# Patient Record
Sex: Male | Born: 1970 | Race: White | Hispanic: No | Marital: Single | Smoking: Never smoker
Health system: Southern US, Community
[De-identification: ages and names within clinical notes are randomized; demographics above are authoritative.]

## PROBLEM LIST (undated history)

## (undated) DIAGNOSIS — Z789 Other specified health status: Secondary | ICD-10-CM

## (undated) DIAGNOSIS — F909 Attention-deficit hyperactivity disorder, unspecified type: Secondary | ICD-10-CM

## (undated) DIAGNOSIS — Z9109 Other allergy status, other than to drugs and biological substances: Secondary | ICD-10-CM

## (undated) HISTORY — PX: NO PAST SURGERIES: SHX2092

## (undated) HISTORY — DX: Attention-deficit hyperactivity disorder, unspecified type: F90.9

---

## 1997-07-17 ENCOUNTER — Emergency Department (HOSPITAL_COMMUNITY): Admission: EM | Admit: 1997-07-17 | Discharge: 1997-07-17 | Payer: Self-pay | Admitting: Emergency Medicine

## 1997-07-23 ENCOUNTER — Emergency Department (HOSPITAL_COMMUNITY): Admission: EM | Admit: 1997-07-23 | Discharge: 1997-07-23 | Payer: Self-pay | Admitting: Emergency Medicine

## 2011-10-01 ENCOUNTER — Ambulatory Visit: Payer: Self-pay | Admitting: Family Medicine

## 2011-10-01 VITALS — BP 130/83 | HR 94 | Temp 99.5°F | Resp 16 | Ht 65.5 in | Wt 169.0 lb

## 2011-10-01 DIAGNOSIS — L089 Local infection of the skin and subcutaneous tissue, unspecified: Secondary | ICD-10-CM

## 2011-10-01 MED ORDER — SULFAMETHOXAZOLE-TRIMETHOPRIM 800-160 MG PO TABS: 1.0000 | ORAL_TABLET | Freq: Two times a day (BID) | ORAL | Status: AC

## 2011-10-01 NOTE — Progress Notes (Signed)
  [facility]:  Office Visit  Chief Complaint:  Chief Complaint  Patient presents with  . Mass    inside nostril and on outside of nose since sun    HPI: Roberto Fisher is a 41 y.o. male who complains of  Nasal skin infection, mass inside left nostril x 5 days. + pain. Fever. No prior skin infection. No SOB, CP. + HA. Denies URI sxs. Tried MOtrin and Tylenol without relief  History reviewed. No pertinent past medical history. History reviewed. No pertinent past surgical history. History   Social History  . Marital Status: Single    Spouse Name: N/A    Number of Children: N/A  . Years of Education: N/A   Social History Main Topics  . Smoking status: Never Smoker   . Smokeless tobacco: None  . Alcohol Use: No  . Drug Use: No  . Sexually Active: None   Other Topics Concern  . None   Social History Narrative  . None   No family history on file. No Known Allergies Prior to Admission medications   Medication Sig Start Date End Date Taking? Authorizing Provider  acetaminophen (TYLENOL) 325 MG tablet Take 650 mg by mouth every 6 (six) hours as needed.   Yes Historical Provider, MD     ROS: The patient denies fevers, chills, night sweats, unintentional weight loss, chest pain, palpitations, wheezing, dyspnea on exertion, nausea, vomiting, abdominal pain, dysuria, hematuria, melena, numbness, weakness, or tingling.   All other systems have been reviewed and were otherwise negative with the exception of those mentioned in the HPI and as above.    PHYSICAL EXAM: Filed Vitals:   10/01/11 1715  BP: 130/83  Pulse: 94  Temp: 99.5 F (37.5 C)  Resp: 16   Filed Vitals:   10/01/11 1715  Height: 5' 5.5" (1.664 m)  Weight: 169 lb (76.658 kg)   Body mass index is 27.70 kg/(m^2).  General: Alert, no acute distress HEENT:  Normocephalic, atraumatic, oropharynx patent. Hard nodular lesion on inner left nasal passage ( not completely blocked-about 60%).   Tender and warm on left maxiallary region. No exudates.  Cardiovascular:  Regular rate and rhythm, no rubs murmurs or gallops.  No Carotid bruits, radial pulse intact. No pedal edema.  Respiratory: Clear to auscultation bilaterally.  No wheezes, rales, or rhonchi.  No cyanosis, no use of accessory musculature GI: No organomegaly, abdomen is soft and non-tender, positive bowel sounds.  No masses. Skin: Hard nodular lesion on inner left nasal passage ( not completely blocked-about 60%).  Tender and warm on left maxiallary region.  Neurologic: Facial musculature symmetric. Psychiatric: Patient is appropriate throughout our interaction. Lymphatic: No cervical lymphadenopathy Musculoskeletal: Gait intact.   LABS: No results found for this or any previous visit.   EKG/XRAY:   Primary read interpreted by Dr. Conley Rolls at [facility].   ASSESSMENT/PLAN: Encounter Diagnosis  Name Primary?  . Skin infection Yes   Rx Bactrim DS BID ? Cellulitis, early onset Abscess  Consider CT of sinuses if worse Patient declines xray today.  Self pay     Rockne Coons, DO 10/01/2011 5:54 PM

## 2011-12-25 ENCOUNTER — Encounter (HOSPITAL_COMMUNITY): Payer: Self-pay | Admitting: Emergency Medicine

## 2011-12-25 ENCOUNTER — Emergency Department (HOSPITAL_COMMUNITY): Payer: Self-pay

## 2011-12-25 ENCOUNTER — Emergency Department (HOSPITAL_COMMUNITY)
Admission: EM | Admit: 2011-12-25 | Discharge: 2011-12-26 | Disposition: A | Discharge status: Home / Self Care | Payer: Self-pay | Attending: Emergency Medicine | Admitting: Emergency Medicine

## 2011-12-25 DIAGNOSIS — N200 Calculus of kidney: Secondary | ICD-10-CM | POA: Insufficient documentation

## 2011-12-25 DIAGNOSIS — M549 Dorsalgia, unspecified: Secondary | ICD-10-CM | POA: Insufficient documentation

## 2011-12-25 LAB — CBC WITH DIFFERENTIAL/PLATELET
Eosinophils Relative: 0 % (ref 0–5)
HCT: 45 % (ref 39.0–52.0)
Hemoglobin: 15.1 g/dL (ref 13.0–17.0)
Lymphocytes Relative: 15 % (ref 12–46)
Lymphs Abs: 2 10*3/uL (ref 0.7–4.0)
MCV: 84 fL (ref 78.0–100.0)
Monocytes Absolute: 0.7 10*3/uL (ref 0.1–1.0)
Monocytes Relative: 6 % (ref 3–12)
Neutro Abs: 10.1 10*3/uL — ABNORMAL HIGH (ref 1.7–7.7)
RDW: 12.4 % (ref 11.5–15.5)
WBC: 12.8 10*3/uL — ABNORMAL HIGH (ref 4.0–10.5)

## 2011-12-25 LAB — URINALYSIS, ROUTINE W REFLEX MICROSCOPIC
Bilirubin Urine: NEGATIVE
Nitrite: NEGATIVE
Protein, ur: NEGATIVE mg/dL
Specific Gravity, Urine: 1.022 (ref 1.005–1.030)
Urobilinogen, UA: 0.2 mg/dL (ref 0.0–1.0)

## 2011-12-25 LAB — COMPREHENSIVE METABOLIC PANEL
Albumin: 4.2 g/dL (ref 3.5–5.2)
Alkaline Phosphatase: 77 U/L (ref 39–117)
BUN: 8 mg/dL (ref 6–23)
Calcium: 9.4 mg/dL (ref 8.4–10.5)
GFR calc Af Amer: >90 mL/min (ref >90)
Potassium: 3.5 mEq/L (ref 3.5–5.1)
Sodium: 139 mEq/L (ref 135–145)
Total Protein: 7.8 g/dL (ref 6.0–8.3)

## 2011-12-25 MED ORDER — OXYCODONE-ACETAMINOPHEN 5-325 MG PO TABS: 1.0000 | ORAL_TABLET | ORAL | Status: DC | PRN

## 2011-12-25 MED ORDER — ONDANSETRON 4 MG PO TBDP
8.0000 mg | ORAL_TABLET | Freq: Once | ORAL | Status: AC
  Administered 2011-12-25: 8 mg via ORAL
  Filled 2011-12-25: qty 2

## 2011-12-25 MED ORDER — OXYCODONE-ACETAMINOPHEN 5-325 MG PO TABS
1.0000 | ORAL_TABLET | Freq: Once | ORAL | Status: AC
  Administered 2011-12-25: 1 via ORAL
  Filled 2011-12-25: qty 1

## 2011-12-25 MED ORDER — IBUPROFEN 800 MG PO TABS: 800.0000 mg | ORAL_TABLET | Freq: Three times a day (TID) | ORAL | Status: DC

## 2011-12-25 NOTE — ED Notes (Signed)
PT. REPORTS RLQ AND RIGHT LOWER BACK PAIN FOR 4 DAYS , DENIES NAUSEA /VOMITTING OR DIARRHEA , NO DYSURIA OR HEMATURIA , NO FEVER OR CHILLS.

## 2011-12-25 NOTE — ED Provider Notes (Signed)
Medical screening examination/treatment/procedure(s) were performed by non-physician practitioner and as supervising physician I was immediately available for consultation/collaboration.   Sanaiya Welliver, MD 12/25/11 2356 

## 2011-12-25 NOTE — ED Notes (Signed)
Patient is resting comfortably. 

## 2011-12-25 NOTE — ED Provider Notes (Signed)
History     CSN: [id number]  Arrival date & time 12/25/11  1859   First MD Initiated Contact with Patient 12/25/11 2204      Chief Complaint  Patient presents with  . Abdominal Pain    (Consider location/radiation/quality/duration/timing/severity/associated sxs/prior treatment) Patient is a 41 y.o. male presenting with abdominal pain. The history is provided by the patient.  Abdominal Pain The primary symptoms of the illness include abdominal pain. The primary symptoms of the illness do not include fever, fatigue, shortness of breath, nausea, vomiting, diarrhea, hematemesis, hematochezia, dysuria, vaginal discharge or vaginal bleeding. Episode onset: 3 days ago  The onset of the illness was gradual. The problem has been gradually worsening.  Associated with: denies abx use, travel, or NSAID use. The patient has not had a change in bowel habit. Additional symptoms associated with the illness include back pain (right flank ). Symptoms associated with the illness do not include chills, anorexia, diaphoresis, heartburn, constipation, urgency, hematuria or frequency. Significant associated medical issues do not include GERD, diabetes, sickle cell disease, gallstones, liver disease, substance abuse, diverticulitis, HIV or cardiac disease.    History reviewed. No pertinent past medical history.  History reviewed. No pertinent past surgical history.  No family history on file.  History  Substance Use Topics  . Smoking status: Never Smoker   . Smokeless tobacco: Not on file  . Alcohol Use: No      Review of Systems  Constitutional: Negative for fever, chills, diaphoresis and fatigue.  Respiratory: Negative for shortness of breath.   Gastrointestinal: Positive for abdominal pain. Negative for heartburn, nausea, vomiting, diarrhea, constipation, hematochezia, anorexia and hematemesis.  Genitourinary: Negative for dysuria, urgency, frequency, hematuria, vaginal bleeding and vaginal  discharge.  Musculoskeletal: Positive for back pain (right flank ).    Allergies  Review of patient's allergies indicates no known allergies.  Home Medications   Current Outpatient Rx  Name Route Sig Dispense Refill  . ACETAMINOPHEN 500 MG PO TABS Oral Take 1,000 mg by mouth every 6 (six) hours as needed. For pain      BP 132/79  Pulse 92  Temp 98 F (36.7 C) (Oral)  Resp 18  SpO2 96%  Physical Exam  Nursing note and vitals reviewed. Constitutional: He is oriented to person, place, and time. He appears well-developed and well-nourished. No distress.  HENT:  Head: Normocephalic and atraumatic.  Eyes: Conjunctivae normal and EOM are normal.  Neck: Normal range of motion. Neck supple.  Cardiovascular: Normal rate, regular rhythm and normal heart sounds.   Pulmonary/Chest: Effort normal.  Abdominal: Soft. Normal appearance and bowel sounds are normal. He exhibits no distension, no ascites and no pulsatile midline mass. There is tenderness (CVA tenderness & RLQ ttp). There is CVA tenderness.  Musculoskeletal: Normal range of motion.  Neurological: He is alert and oriented to person, place, and time.  Skin: Skin is warm and dry. He is not diaphoretic.  Psychiatric: He has a normal mood and affect. His behavior is normal.    ED Course  Procedures (including critical care time)  Labs Reviewed  URINALYSIS, ROUTINE W REFLEX MICROSCOPIC - Abnormal; Notable for the following:    Hgb urine dipstick TRACE (*)     All other components within normal limits  COMPREHENSIVE METABOLIC PANEL - Abnormal; Notable for the following:    Glucose, Bld 116 (*)     All other components within normal limits  CBC WITH DIFFERENTIAL - Abnormal; Notable for the following:    WBC 12.8 (*)  Neutrophils Relative 79 (*)     Neutro Abs 10.1 (*)     All other components within normal limits  URINE MICROSCOPIC-ADD ON   Ct Abdomen Pelvis Wo Contrast  12/25/2011  *RADIOLOGY REPORT*  Clinical Data:  Right sided flank pain and abdominal pain.  CT ABDOMEN AND PELVIS WITHOUT CONTRAST  Technique:  Multidetector CT imaging of the abdomen and pelvis was performed following the standard protocol without intravenous contrast.  Comparison: None.  Findings: Minimal dependent changes in the lung bases.  Small esophageal hiatal hernia.  Mild distal esophageal wall thickening could be due to under distension or reflux.  There is a punctate sized stone in the lower pole of the left kidney without obstruction.  No right renal, ureteral, or bladder stones are visualized.  The bladder wall is not thickened. Exophytic hyperdense lesion arising from the midpole of the left kidney laterally measures 13 mm diameter.  This could represent a hemorrhagic cyst although solid mass is not excluded.  Consider ultrasound for further evaluation.  Circumscribed low attenuation lesions in the anterior right lobe of liver and medial left lobe of liver measuring 14 and 12 mm diameter, respectively, these likely represent cysts.  The unenhanced appearance of the spleen, gallbladder, pancreas, adrenal glands, abdominal aorta, and retroperitoneal lymph nodes is unremarkable.  The stomach, small bowel, and colon are decompressed.  Diffusely stool filled colon.  No free air or free fluid in the abdomen.  Pelvis:  Prostate gland is not enlarged.  Bladder wall is not thickened.  No free or loculated pelvic fluid collections.  No evidence of diverticulitis.  The appendix is normal.  Mild degenerative changes in the lumbar spine.  IMPRESSION: Punctate sized nonobstructing stone in the left kidney.  No additional renal or ureteral stones are demonstrated.  No urinary tract obstruction.  13 mm hyperattenuating lesion in the left kidney probably represents a hemorrhagic cyst.  Solid mass not entirely excluded.   Original Report Authenticated By: Marlon Pel, M.D.      No diagnosis found.    MDM  Kidney stone  Pt has been diagnosed with a  Kidney Stone via CT. Incidental finding of left kidney lesion also discussed w pt & recommended follow up. There is no evidence of significant hydronephrosis, serum creatine WNL, vitals sign stable and the pt does not have irratractable vomiting. Pt will be dc home with pain medications & has been advised to follow up with PCP.          Jaci Carrel, [place] 12/25/11 2351

## 2013-06-03 ENCOUNTER — Emergency Department (HOSPITAL_COMMUNITY): Payer: Self-pay

## 2013-06-03 ENCOUNTER — Inpatient Hospital Stay (HOSPITAL_COMMUNITY)
Admission: EM | Admit: 2013-06-03 | Discharge: 2013-06-08 | DRG: 872 | Disposition: A | Discharge status: Home / Self Care | Payer: Self-pay | Attending: Family Medicine | Admitting: Family Medicine

## 2013-06-03 ENCOUNTER — Encounter (HOSPITAL_COMMUNITY): Payer: Self-pay | Admitting: Emergency Medicine

## 2013-06-03 ENCOUNTER — Emergency Department (INDEPENDENT_AMBULATORY_CARE_PROVIDER_SITE_OTHER)
Admission: EM | Admit: 2013-06-03 | Discharge: 2013-06-03 | Disposition: A | Discharge status: Nursing Home (Includes ICF) | Payer: Self-pay | Source: Home / Self Care | Attending: Emergency Medicine | Admitting: Emergency Medicine

## 2013-06-03 DIAGNOSIS — L0291 Cutaneous abscess, unspecified: Secondary | ICD-10-CM

## 2013-06-03 DIAGNOSIS — A419 Sepsis, unspecified organism: Principal | ICD-10-CM | POA: Diagnosis present

## 2013-06-03 DIAGNOSIS — L02219 Cutaneous abscess of trunk, unspecified: Secondary | ICD-10-CM | POA: Diagnosis present

## 2013-06-03 DIAGNOSIS — L0390 Cellulitis, unspecified: Secondary | ICD-10-CM

## 2013-06-03 DIAGNOSIS — Z8249 Family history of ischemic heart disease and other diseases of the circulatory system: Secondary | ICD-10-CM

## 2013-06-03 DIAGNOSIS — L03319 Cellulitis of trunk, unspecified: Secondary | ICD-10-CM

## 2013-06-03 DIAGNOSIS — Z833 Family history of diabetes mellitus: Secondary | ICD-10-CM

## 2013-06-03 DIAGNOSIS — F411 Generalized anxiety disorder: Secondary | ICD-10-CM | POA: Diagnosis present

## 2013-06-03 DIAGNOSIS — L03311 Cellulitis of abdominal wall: Secondary | ICD-10-CM | POA: Diagnosis present

## 2013-06-03 HISTORY — DX: Other specified health status: Z78.9

## 2013-06-03 LAB — BASIC METABOLIC PANEL
BUN: 17 mg/dL (ref 6–23)
CALCIUM: 9.4 mg/dL (ref 8.4–10.5)
CO2: 27 mEq/L (ref 19–32)
Chloride: 97 mEq/L (ref 96–112)
Creatinine, Ser: 1.07 mg/dL (ref 0.50–1.35)
GFR, EST NON AFRICAN AMERICAN: 83 mL/min — AB (ref >90)
Glucose, Bld: 112 mg/dL — ABNORMAL HIGH (ref 70–99)
POTASSIUM: 4.8 meq/L (ref 3.7–5.3)
SODIUM: 140 meq/L (ref 137–147)

## 2013-06-03 LAB — CBC WITH DIFFERENTIAL/PLATELET
BASOS ABS: 0 10*3/uL (ref 0.0–0.1)
BASOS PCT: 0 % (ref 0–1)
EOS ABS: 0 10*3/uL (ref 0.0–0.7)
Eosinophils Relative: 0 % (ref 0–5)
HCT: 43.3 % (ref 39.0–52.0)
Hemoglobin: 14.9 g/dL (ref 13.0–17.0)
Lymphocytes Relative: 3 % — ABNORMAL LOW (ref 12–46)
Lymphs Abs: 0.7 10*3/uL (ref 0.7–4.0)
MCH: 28.9 pg (ref 26.0–34.0)
MCHC: 34.4 g/dL (ref 30.0–36.0)
MCV: 83.9 fL (ref 78.0–100.0)
Monocytes Absolute: 1.7 10*3/uL — ABNORMAL HIGH (ref 0.1–1.0)
Monocytes Relative: 8 % (ref 3–12)
NEUTROS ABS: 19.3 10*3/uL — AB (ref 1.7–7.7)
NEUTROS PCT: 89 % — AB (ref 43–77)
Platelets: 228 10*3/uL (ref 150–400)
RBC: 5.16 MIL/uL (ref 4.22–5.81)
RDW: 12 % (ref 11.5–15.5)
WBC: 21.8 10*3/uL — ABNORMAL HIGH (ref 4.0–10.5)

## 2013-06-03 LAB — I-STAT CG4 LACTIC ACID, ED: Lactic Acid, Venous: 2.73 mmol/L — ABNORMAL HIGH (ref 0.5–2.2)

## 2013-06-03 MED ORDER — CLINDAMYCIN PHOSPHATE 900 MG/50ML IV SOLN
900.0000 mg | Freq: Once | INTRAVENOUS | Status: AC
  Administered 2013-06-03: 900 mg via INTRAVENOUS
  Filled 2013-06-03: qty 50

## 2013-06-03 MED ORDER — IOHEXOL 300 MG/ML  SOLN
80.0000 mL | Freq: Once | INTRAMUSCULAR | Status: AC | PRN
  Administered 2013-06-03: 80 mL via INTRAVENOUS

## 2013-06-03 MED ORDER — IOHEXOL 300 MG/ML  SOLN
25.0000 mL | Freq: Once | INTRAMUSCULAR | Status: AC | PRN
  Administered 2013-06-03: 25 mL via ORAL

## 2013-06-03 MED ORDER — ONDANSETRON 4 MG PO TBDP
ORAL_TABLET | ORAL | Status: AC
  Filled 2013-06-03: qty 1

## 2013-06-03 MED ORDER — SODIUM CHLORIDE 0.9 % IV SOLN: INTRAVENOUS | Status: DC

## 2013-06-03 MED ORDER — ONDANSETRON 4 MG PO TBDP
8.0000 mg | ORAL_TABLET | Freq: Once | ORAL | Status: AC
  Administered 2013-06-03: 8 mg via ORAL

## 2013-06-03 MED ORDER — LORAZEPAM 1 MG PO TABS
1.0000 mg | ORAL_TABLET | Freq: Once | ORAL | Status: AC
  Administered 2013-06-03: 1 mg via ORAL
  Filled 2013-06-03: qty 1

## 2013-06-03 MED ORDER — SODIUM CHLORIDE 0.9 % IV BOLUS (SEPSIS)
1000.0000 mL | Freq: Once | INTRAVENOUS | Status: AC
  Administered 2013-06-04: 1000 mL via INTRAVENOUS

## 2013-06-03 MED ORDER — MORPHINE SULFATE 2 MG/ML IJ SOLN
2.0000 mg | Freq: Once | INTRAMUSCULAR | Status: AC
  Administered 2013-06-04: 2 mg via INTRAVENOUS
  Filled 2013-06-03: qty 1

## 2013-06-03 MED ORDER — ACETAMINOPHEN 325 MG PO TABS
650.0000 mg | ORAL_TABLET | Freq: Once | ORAL | Status: AC
  Administered 2013-06-03: 650 mg via ORAL
  Filled 2013-06-03: qty 2

## 2013-06-03 MED ORDER — ONDANSETRON HCL 4 MG/2ML IJ SOLN
INTRAMUSCULAR | Status: AC
  Filled 2013-06-03: qty 2

## 2013-06-03 MED ORDER — SODIUM CHLORIDE 0.9 % IV BOLUS (SEPSIS)
1000.0000 mL | Freq: Once | INTRAVENOUS | Status: AC
  Administered 2013-06-03: 1000 mL via INTRAVENOUS

## 2013-06-03 NOTE — ED Provider Notes (Signed)
  Chief Complaint   No chief complaint on file.   History of Present Illness   Roberto Fisher is a 43 year old male who's had a three-day history of an abscess in the left lower quadrant of his abdomen. This started out as a small red bump which he attributed to an insect bite, however he did not see anything biting him. Additionally, he said temperature 100.4 degrees, chills, tachycardia, nausea, and vomiting. He denies any shortness of breath or chest pain. No prior history of abscesses, skin infections, MRSA, or diabetes.  Review of Systems   Other than as noted above, the patient denies any of the following symptoms: Systemic:  No fever, chills or sweats. Skin:  No rash or itching.  [place]   Past medical history, family history, social history, meds, and allergies were reviewed.   Physical Examination     Vital signs:  BP 146/93  Pulse 123  Temp(Src) 100.4 F (38 C) (Oral)  Resp 16  SpO2 98% Skin:  He has a very large, indurated, tender, raised, red abscess in the left lower quadrant of his abdomen, taking up the entire left lower quadrant. There was no fluctuance.  Skin exam was otherwise normal.  No rash. Ext:  Distal pulses were full, patient has full ROM of all joints.  Course in [facility]   He had persisting emesis in the urgent care Center and was given Zofran ODT 4 mg sublingually prior to being transferred to the emergency room.  Assessment   The encounter diagnosis was Abscess.  He will need the IV saline his protocol, since she's not able to keep by mouth and buttocks down. We will transfer him to the emergency department for this. He will need incision and drainage when he gets there.  Plan   The patient was transferred to the ED via shuttle in stable condition.  Medical Decision Making   43 year old male has a 3 day history of a very large abscess in the LLQ of his abdomen, fever, chills, vomiting, and tachycardia.  He has never had anything  like this before--no history of MRSA or diabetes.  On exam he has a very large, tender abscess in the LLQ of his abdomen.  I think he will need the cellulitis protocol with IV antibiotics as well as an I&D.       Roberto Mo, MD 06/03/13 [phone]

## 2013-06-03 NOTE — Discharge Instructions (Signed)
We have determined that your problem requires further evaluation in the emergency department.  We will take care of your transport there.  Once at the emergency department, you will be evaluated by a provider and they will order whatever treatment or tests they deem necessary.  We cannot guarantee that they will do any specific test or do any specific treatment.  ° °

## 2013-06-03 NOTE — ED Notes (Signed)
Patient transported to X-ray 

## 2013-06-03 NOTE — ED Provider Notes (Signed)
Medical screening examination/treatment/procedure(s) were conducted as a shared visit with non-physician practitioner(s) and myself.  I personally evaluated the patient during the encounter.   EKG Interpretation None      PT with large area of cellulitis and ?abscess on bedside US but no definite collection on CT. Admit for fever,leukocytosis. Abx given.   Amonda Brillhart B. Karle Starch, MD 06/03/13 2358

## 2013-06-03 NOTE — ED Notes (Signed)
Abscess LLQ abdomen onset 3 days ago.  No drainage.  He squeezed it yesterday and it drained yellow pus. Dr. Jake Michaelis said it is to large to be done here at [facility] and needs to go to ED.  Pt. Vomited large amount of yellowish liquid  in garbage can.  Appears pale.

## 2013-06-03 NOTE — ED Notes (Signed)
Pt. reports left abdomen skin abscess for 3 days with drainage , denies fever or chills.

## 2013-06-03 NOTE — ED Provider Notes (Signed)
CSN: [id number]     Arrival date & time 06/03/13  1852 History   First MD Initiated Contact with Patient 06/03/13 2013     Chief Complaint  Patient presents with  . Abscess   HPI  History provided by the patient and recent medical chart. Patient is a 43 year old male with no significant PMH who presents for further evaluation and treatment of the left lower abdomen abscess and cellulitis. Patient was sent from urgent care center prior to arrival. He reports having increasing redness and swelling to the skin of his lower abdomen for the past 3 days. He also states he's been feeling sick for the past week with sore throat, cough, congestion, fever and sweats. He states he has had some increased fatigue especially since he has had a redness to the skin of his abdomen. Denies having similar symptoms previously. He reports the area first began but a small ingrown hair or pimple. He denied having any significant bleeding or drainage. He denies any other pertinent past medical history. No other aggravating or alleviating factors. No other associated symptoms.    History reviewed. No pertinent past medical history. History reviewed. No pertinent past surgical history. No family history on file. History  Substance Use Topics  . Smoking status: Never Smoker   . Smokeless tobacco: Not on file  . Alcohol Use: No    Review of Systems  Constitutional: Positive for fever, chills and fatigue.  HENT: Positive for congestion, rhinorrhea and sore throat.   Respiratory: Positive for cough.   Gastrointestinal: Negative for nausea, vomiting and diarrhea.  All other systems reviewed and are negative.      Allergies  Review of patient's allergies indicates no known allergies.  Home Medications   Current Outpatient Rx  Name  Route  Sig  Dispense  Refill  . acetaminophen (TYLENOL) 500 MG tablet   Oral   Take 1,000 mg by mouth every 6 (six) hours as needed. For pain          BP 92/70  Pulse 129   Temp(Src) 101.1 F (38.4 C) (Oral)  Resp 24  Wt 158 lb (71.668 kg)  SpO2 100% Physical Exam  Nursing note and vitals reviewed. Constitutional: He is oriented to person, place, and time. He appears well-developed and well-nourished. No distress.  HENT:  Head: Normocephalic.  Cardiovascular: Normal rate and regular rhythm.   Pulmonary/Chest: Effort normal and breath sounds normal. No respiratory distress. He has no wheezes. He has no rales.  Abdominal: Soft. He exhibits no distension. There is tenderness. There is no rebound and no guarding.  Large area of erythema to the left lower abdomen. There is central nodularity and induration. No active bleeding or drainage. Bedside ultrasound shows large fluid collection.  Musculoskeletal: Normal range of motion.  Neurological: He is alert and oriented to person, place, and time.  Skin: Skin is warm.  Psychiatric: He has a normal mood and affect. His behavior is normal.    ED Course  Procedures   DIAGNOSTIC STUDIES: Oxygen Saturation is 98% on room air.    COORDINATION OF CARE:  Nursing notes reviewed. Vital signs reviewed. Initial pt interview and examination performed.   9:05 PM-patient seen and evaluated. He appears in moderate distress. Does not appear severely ill or toxic. Patient is febrile with tachycardia. Blood pressures are stable.   9:50 PM patient also seen and evaluated with attending physician. Given the size of abscess on the bedside ultrasound we'll proceed to CT scan to rule  out muscle involvement.  11:50 PM CT scan does not show significant abscess. Puncture I&D also did not express any purulent fluid.  Spoke with Dr. Glyn Ade [facility]. He will see patient and admit. Would like blood cultures ordered.    INCISION AND DRAINAGE Performed by: Martie Lee Consent: Verbal consent obtained. Risks and benefits: risks, benefits and alternatives were discussed Type: abscess  Body area: Left lower  abdominal wall  Anesthesia: local infiltration  Incision was made with a scalpel. Single puncture incision used.  Local anesthetic: lidocaine 2% with epinephrine  Anesthetic total: 2 ml  Complexity: Simple   Drainage: purulent  Drainage amount: None   Packing material: None   Patient tolerance: Patient tolerated the procedure well with no immediate complications.      Treatment plan initiated: Medications  sodium chloride 0.9 % bolus 1,000 mL (not administered)  clindamycin (CLEOCIN) IVPB 900 mg (not administered)  acetaminophen (TYLENOL) tablet 650 mg (not administered)  LORazepam (ATIVAN) tablet 1 mg (not administered)   Results for orders placed during the hospital encounter of 06/03/13  CBC WITH DIFFERENTIAL      Result Value Ref Range   WBC 21.8 (*) 4.0 - 10.5 K/uL   RBC 5.16  4.22 - 5.81 MIL/uL   Hemoglobin 14.9  13.0 - 17.0 g/dL   HCT 43.3  39.0 - 52.0 %   MCV 83.9  78.0 - 100.0 fL   MCH 28.9  26.0 - 34.0 pg   MCHC 34.4  30.0 - 36.0 g/dL   RDW 12.0  11.5 - 15.5 %   Platelets 228  150 - 400 K/uL   Neutrophils Relative % 89 (*) 43 - 77 %   Neutro Abs 19.3 (*) 1.7 - 7.7 K/uL   Lymphocytes Relative 3 (*) 12 - 46 %   Lymphs Abs 0.7  0.7 - 4.0 K/uL   Monocytes Relative 8  3 - 12 %   Monocytes Absolute 1.7 (*) 0.1 - 1.0 K/uL   Eosinophils Relative 0  0 - 5 %   Eosinophils Absolute 0.0  0.0 - 0.7 K/uL   Basophils Relative 0  0 - 1 %   Basophils Absolute 0.0  0.0 - 0.1 K/uL  BASIC METABOLIC PANEL      Result Value Ref Range   Sodium 140  137 - 147 mEq/L   Potassium 4.8  3.7 - 5.3 mEq/L   Chloride 97  96 - 112 mEq/L   CO2 27  19 - 32 mEq/L   Glucose, Bld 112 (*) 70 - 99 mg/dL   BUN 17  6 - 23 mg/dL   Creatinine, Ser 1.07  0.50 - 1.35 mg/dL   Calcium 9.4  8.4 - 10.5 mg/dL   GFR calc non Af Amer 83 (*) >90 mL/min   GFR calc Af Amer >90  >90 mL/min  I-STAT CG4 LACTIC ACID, ED      Result Value Ref Range   Lactic Acid, Venous 2.73 (*) 0.5 - 2.2 mmol/L      Imaging Review Dg Chest 2 View  06/03/2013   CLINICAL DATA:  Fever.  EXAM: CHEST  2 VIEW  COMPARISON:  None.  FINDINGS: The heart size and mediastinal contours are within normal limits. Both lungs are clear. The visualized skeletal structures are unremarkable.  IMPRESSION: No active cardiopulmonary disease.   Electronically Signed   By: Marcello Moores  Register   On: 06/03/2013 21:56   Ct Abdomen Pelvis W Contrast  06/03/2013   CLINICAL DATA:  Left lower quadrant abdominal pain.  EXAM: CT ABDOMEN AND PELVIS WITH CONTRAST  TECHNIQUE: Multidetector CT imaging of the abdomen and pelvis was performed using the standard protocol following bolus administration of intravenous contrast.  CONTRAST:  32mL OMNIPAQUE IOHEXOL 300 MG/ML  SOLN  COMPARISON:  CT of the abdomen and pelvis 12/25/2011.  FINDINGS: Lung Bases: Unremarkable.  Abdomen/Pelvis: Multiple low-attenuation liver lesions, similar to the prior examination. Largest of these are compatible with simple cysts, with the largest lesion in segment 2 measuring up to 15 mm in diameter. The appearance of the gallbladder, pancreas, spleen, bilateral adrenal glands and right kidney is unremarkable. There are 2 intermediate attenuation lesions in the left kidney, which appear similar to the prior study, likely to represent proteinaceous cysts, largest of which measures 19 mm in the interpolar region. Probable 2 mm nonobstructive calculus in the interpolar collecting system of the left kidney, similar to the prior study.  No significant volume of ascites. No pneumoperitoneum. No pathologic distention of small bowel. No lymphadenopathy identified within the abdomen or pelvis. Prostate gland and urinary bladder are unremarkable in appearance.  Musculoskeletal: There is a large amount of soft tissue stranding throughout the subcutaneous fat of the lower left anterior abdominal wall extending into the left flank. At this time, no definite well-defined rim enhancing fluid  collection is identified to suggest presence of an abscess. There are no aggressive appearing lytic or blastic lesions noted in the visualized portions of the skeleton.  IMPRESSION: 1. Extensive inflammation in the subcutaneous fat of the lower left anterior abdominal wall and left flank. Findings appear compatible with cellulitis and possible phlegmon, but no definite abscess is identified at this time. 2. No acute intra-abdominal process. 3. Additional incidental findings, as above, similar prior studies.   Electronically Signed   By: Vinnie Langton M.D.   On: 06/03/2013 23:17     EKG Interpretation None      MDM   Final diagnoses:  Cellulitis of left abdominal wall       Martie Lee, PA-C 06/03/13 2355

## 2013-06-04 ENCOUNTER — Encounter (HOSPITAL_COMMUNITY): Payer: Self-pay | Admitting: Internal Medicine

## 2013-06-04 DIAGNOSIS — L02219 Cutaneous abscess of trunk, unspecified: Secondary | ICD-10-CM

## 2013-06-04 DIAGNOSIS — A419 Sepsis, unspecified organism: Secondary | ICD-10-CM | POA: Diagnosis present

## 2013-06-04 DIAGNOSIS — L03319 Cellulitis of trunk, unspecified: Secondary | ICD-10-CM

## 2013-06-04 LAB — CBC WITH DIFFERENTIAL/PLATELET
Basophils Absolute: 0 10*3/uL (ref 0.0–0.1)
Basophils Relative: 0 % (ref 0–1)
Eosinophils Absolute: 0 10*3/uL (ref 0.0–0.7)
Eosinophils Relative: 0 % (ref 0–5)
HCT: 37.4 % — ABNORMAL LOW (ref 39.0–52.0)
Hemoglobin: 12.7 g/dL — ABNORMAL LOW (ref 13.0–17.0)
LYMPHS PCT: 5 % — AB (ref 12–46)
Lymphs Abs: 1 10*3/uL (ref 0.7–4.0)
MCH: 28.4 pg (ref 26.0–34.0)
MCHC: 34 g/dL (ref 30.0–36.0)
MCV: 83.7 fL (ref 78.0–100.0)
Monocytes Absolute: 1.5 10*3/uL — ABNORMAL HIGH (ref 0.1–1.0)
Monocytes Relative: 8 % (ref 3–12)
NEUTROS PCT: 87 % — AB (ref 43–77)
Neutro Abs: 15.7 10*3/uL — ABNORMAL HIGH (ref 1.7–7.7)
PLATELETS: 190 10*3/uL (ref 150–400)
RBC: 4.47 MIL/uL (ref 4.22–5.81)
RDW: 12.2 % (ref 11.5–15.5)
WBC: 18.1 10*3/uL — ABNORMAL HIGH (ref 4.0–10.5)

## 2013-06-04 LAB — CBC
HEMATOCRIT: 36.2 % — AB (ref 39.0–52.0)
Hemoglobin: 12.3 g/dL — ABNORMAL LOW (ref 13.0–17.0)
MCH: 28.6 pg (ref 26.0–34.0)
MCHC: 34 g/dL (ref 30.0–36.0)
MCV: 84.2 fL (ref 78.0–100.0)
Platelets: 210 10*3/uL (ref 150–400)
RBC: 4.3 MIL/uL (ref 4.22–5.81)
RDW: 12.3 % (ref 11.5–15.5)
WBC: 20 10*3/uL — AB (ref 4.0–10.5)

## 2013-06-04 LAB — INFLUENZA PANEL BY PCR (TYPE A & B)
H1N1FLUPCR: NOT DETECTED
INFLBPCR: NEGATIVE
Influenza A By PCR: NEGATIVE

## 2013-06-04 LAB — LACTIC ACID, PLASMA: LACTIC ACID, VENOUS: 1.1 mmol/L (ref 0.5–2.2)

## 2013-06-04 LAB — COMPREHENSIVE METABOLIC PANEL
ALBUMIN: 2.7 g/dL — AB (ref 3.5–5.2)
ALT: 9 U/L (ref 0–53)
AST: 13 U/L (ref 0–37)
Alkaline Phosphatase: 66 U/L (ref 39–117)
BILIRUBIN TOTAL: 1.2 mg/dL (ref 0.3–1.2)
BUN: 13 mg/dL (ref 6–23)
CHLORIDE: 100 meq/L (ref 96–112)
CO2: 25 mEq/L (ref 19–32)
CREATININE: 1.06 mg/dL (ref 0.50–1.35)
Calcium: 8 mg/dL — ABNORMAL LOW (ref 8.4–10.5)
GFR, EST NON AFRICAN AMERICAN: 84 mL/min — AB (ref >90)
Glucose, Bld: 104 mg/dL — ABNORMAL HIGH (ref 70–99)
Potassium: 4.3 mEq/L (ref 3.7–5.3)
Sodium: 138 mEq/L (ref 137–147)
Total Protein: 6.4 g/dL (ref 6.0–8.3)

## 2013-06-04 MED ORDER — HYDROMORPHONE HCL PF 1 MG/ML IJ SOLN
1.0000 mg | INTRAMUSCULAR | Status: DC | PRN
  Administered 2013-06-05 – 2013-06-07 (×2): 1 mg via INTRAVENOUS
  Filled 2013-06-04 (×2): qty 1

## 2013-06-04 MED ORDER — ONDANSETRON HCL 4 MG PO TABS: 4.0000 mg | ORAL_TABLET | Freq: Four times a day (QID) | ORAL | Status: DC | PRN

## 2013-06-04 MED ORDER — BOOST / RESOURCE BREEZE PO LIQD
1.0000 | Freq: Two times a day (BID) | ORAL | Status: DC
  Administered 2013-06-04 – 2013-06-08 (×7): 1 via ORAL

## 2013-06-04 MED ORDER — SODIUM CHLORIDE 0.9 % IV SOLN
INTRAVENOUS | Status: AC
  Administered 2013-06-04 (×2): via INTRAVENOUS
  Administered 2013-06-04: 100 mL/h via INTRAVENOUS

## 2013-06-04 MED ORDER — ACETAMINOPHEN 325 MG PO TABS
650.0000 mg | ORAL_TABLET | Freq: Four times a day (QID) | ORAL | Status: DC | PRN
  Administered 2013-06-04 – 2013-06-05 (×6): 650 mg via ORAL
  Filled 2013-06-04 (×6): qty 2

## 2013-06-04 MED ORDER — PIPERACILLIN-TAZOBACTAM 3.375 G IVPB
3.3750 g | Freq: Three times a day (TID) | INTRAVENOUS | Status: DC
  Administered 2013-06-04 – 2013-06-08 (×14): 3.375 g via INTRAVENOUS
  Filled 2013-06-04 (×16): qty 50

## 2013-06-04 MED ORDER — VANCOMYCIN HCL 10 G IV SOLR
1250.0000 mg | Freq: Two times a day (BID) | INTRAVENOUS | Status: DC
  Administered 2013-06-04 – 2013-06-08 (×10): 1250 mg via INTRAVENOUS
  Filled 2013-06-04 (×11): qty 1250

## 2013-06-04 MED ORDER — ONDANSETRON HCL 4 MG/2ML IJ SOLN
4.0000 mg | Freq: Four times a day (QID) | INTRAMUSCULAR | Status: DC | PRN
  Administered 2013-06-04: 4 mg via INTRAVENOUS
  Filled 2013-06-04: qty 2

## 2013-06-04 MED ORDER — ACETAMINOPHEN 650 MG RE SUPP: 650.0000 mg | Freq: Four times a day (QID) | RECTAL | Status: DC | PRN

## 2013-06-04 MED ORDER — ALPRAZOLAM 0.5 MG PO TABS
0.5000 mg | ORAL_TABLET | Freq: Three times a day (TID) | ORAL | Status: DC | PRN
  Administered 2013-06-04 – 2013-06-08 (×5): 0.5 mg via ORAL
  Filled 2013-06-04 (×6): qty 1

## 2013-06-04 NOTE — Progress Notes (Signed)
I agree with the Student-Dietitian note and made appropriate revisions.  Katie Merve Hotard, RD, LDN Pager #: [phone] After-Hours Pager #: [phone]  

## 2013-06-04 NOTE — Consult Note (Signed)
Reason for Consult:Cellulitis Referring Physician: [name] is an 43 y.o. male.  HPI: Roberto Fisher, who is quite loquacious, was in his usual state of health until Tuesday when he noticed a bump on his abdomen in the LLQ. He assumed it was a mosquito bite and admits to scratching it and treating it with alcohol and H2O2. Over the next two days it became red, painful, and swollen until it was "like a baseball". He sought care at the [facility] yesterday and they sent him to the ED. The EDP attempted in I&D but did not encounter any purulence. He describes fever up to 102+ and vomited at the urgent care prior to transport. He denies chills, sweats, or prior episodes.  Past Medical History  Diagnosis Date  . Medical history non-contributory     Past Surgical History  Procedure Laterality Date  . No past surgeries      Family History  Problem Relation Age of Onset  . Hypertension Mother   . Diabetes Mellitus II Father     Social History:  reports that he has never smoked. He does not have any smokeless tobacco history on file. He reports that he does not drink alcohol or use illicit drugs.  Allergies: No Known Allergies  Medications: I have reviewed the patient's current medications.  Results for orders placed during the hospital encounter of 06/03/13 (from the past 48 hour(s))  CBC WITH DIFFERENTIAL     Status: Abnormal   Collection Time    06/03/13  8:14 PM      Result Value Ref Range   WBC 21.8 (*) 4.0 - 10.5 K/uL   RBC 5.16  4.22 - 5.81 MIL/uL   Hemoglobin 14.9  13.0 - 17.0 g/dL   HCT 43.3  39.0 - 52.0 %   MCV 83.9  78.0 - 100.0 fL   MCH 28.9  26.0 - 34.0 pg   MCHC 34.4  30.0 - 36.0 g/dL   RDW 12.0  11.5 - 15.5 %   Platelets 228  150 - 400 K/uL   Neutrophils Relative % 89 (*) 43 - 77 %   Neutro Abs 19.3 (*) 1.7 - 7.7 K/uL   Lymphocytes Relative 3 (*) 12 - 46 %   Lymphs Abs 0.7  0.7 - 4.0 K/uL   Monocytes Relative 8  3 - 12 %   Monocytes  Absolute 1.7 (*) 0.1 - 1.0 K/uL   Eosinophils Relative 0  0 - 5 %   Eosinophils Absolute 0.0  0.0 - 0.7 K/uL   Basophils Relative 0  0 - 1 %   Basophils Absolute 0.0  0.0 - 0.1 K/uL  BASIC METABOLIC PANEL     Status: Abnormal   Collection Time    06/03/13  8:14 PM      Result Value Ref Range   Sodium 140  137 - 147 mEq/L   Potassium 4.8  3.7 - 5.3 mEq/L   Chloride 97  96 - 112 mEq/L   CO2 27  19 - 32 mEq/L   Glucose, Bld 112 (*) 70 - 99 mg/dL   BUN 17  6 - 23 mg/dL   Creatinine, Ser 1.07  0.50 - 1.35 mg/dL   Calcium 9.4  8.4 - 10.5 mg/dL   GFR calc non Af Amer 83 (*) >90 mL/min   GFR calc Af Amer >90  >90 mL/min   Comment: (NOTE)     The eGFR has been calculated using the CKD  EPI equation.     This calculation has not been validated in all clinical situations.     eGFR's persistently <90 mL/min signify possible Chronic Kidney     Disease.  I-STAT CG4 LACTIC ACID, ED     Status: Abnormal   Collection Time    06/03/13  8:53 PM      Result Value Ref Range   Lactic Acid, Venous 2.73 (*) 0.5 - 2.2 mmol/L  INFLUENZA PANEL BY PCR (TYPE A & B, H1N1)     Status: None   Collection Time    06/04/13 12:33 AM      Result Value Ref Range   Influenza A By PCR NEGATIVE  NEGATIVE   Influenza B By PCR NEGATIVE  NEGATIVE   H1N1 flu by pcr NOT DETECTED  NOT DETECTED   Comment:            The Xpert Flu assay (FDA approved for     nasal aspirates or washes and     nasopharyngeal swab specimens), is     intended as an aid in the diagnosis of     influenza and should not be used as     a sole basis for treatment.  COMPREHENSIVE METABOLIC PANEL     Status: Abnormal   Collection Time    06/04/13  6:30 AM      Result Value Ref Range   Sodium 138  137 - 147 mEq/L   Potassium 4.3  3.7 - 5.3 mEq/L   Chloride 100  96 - 112 mEq/L   CO2 25  19 - 32 mEq/L   Glucose, Bld 104 (*) 70 - 99 mg/dL   BUN 13  6 - 23 mg/dL   Creatinine, Ser 1.06  0.50 - 1.35 mg/dL   Calcium 8.0 (*) 8.4 - 10.5 mg/dL    Total Protein 6.4  6.0 - 8.3 g/dL   Albumin 2.7 (*) 3.5 - 5.2 g/dL   AST 13  0 - 37 U/L   Comment: HEMOLYSIS AT THIS LEVEL MAY AFFECT RESULT   ALT 9  0 - 53 U/L   Alkaline Phosphatase 66  39 - 117 U/L   Total Bilirubin 1.2  0.3 - 1.2 mg/dL   GFR calc non Af Amer 84 (*) >90 mL/min   GFR calc Af Amer >90  >90 mL/min   Comment: (NOTE)     The eGFR has been calculated using the CKD EPI equation.     This calculation has not been validated in all clinical situations.     eGFR's persistently <90 mL/min signify possible Chronic Kidney     Disease.  CBC WITH DIFFERENTIAL     Status: Abnormal   Collection Time    06/04/13  6:30 AM      Result Value Ref Range   WBC 18.1 (*) 4.0 - 10.5 K/uL   RBC 4.47  4.22 - 5.81 MIL/uL   Hemoglobin 12.7 (*) 13.0 - 17.0 g/dL   HCT 37.4 (*) 39.0 - 52.0 %   MCV 83.7  78.0 - 100.0 fL   MCH 28.4  26.0 - 34.0 pg   MCHC 34.0  30.0 - 36.0 g/dL   RDW 12.2  11.5 - 15.5 %   Platelets 190  150 - 400 K/uL   Neutrophils Relative % 87 (*) 43 - 77 %   Neutro Abs 15.7 (*) 1.7 - 7.7 K/uL   Lymphocytes Relative 5 (*) 12 - 46 %   Lymphs Abs 1.0  0.7 - 4.0 K/uL   Monocytes Relative 8  3 - 12 %   Monocytes Absolute 1.5 (*) 0.1 - 1.0 K/uL   Eosinophils Relative 0  0 - 5 %   Eosinophils Absolute 0.0  0.0 - 0.7 K/uL   Basophils Relative 0  0 - 1 %   Basophils Absolute 0.0  0.0 - 0.1 K/uL  LACTIC ACID, PLASMA     Status: None   Collection Time    06/04/13  6:30 AM      Result Value Ref Range   Lactic Acid, Venous 1.1  0.5 - 2.2 mmol/L    Dg Chest 2 View  06/03/2013   CLINICAL DATA:  Fever.  EXAM: CHEST  2 VIEW  COMPARISON:  None.  FINDINGS: The heart size and mediastinal contours are within normal limits. Both lungs are clear. The visualized skeletal structures are unremarkable.  IMPRESSION: No active cardiopulmonary disease.   Electronically Signed   By: Marcello Moores  Register   On: 06/03/2013 21:56   Ct Abdomen Pelvis W Contrast  06/03/2013   CLINICAL DATA:  Left lower  quadrant abdominal pain.  EXAM: CT ABDOMEN AND PELVIS WITH CONTRAST  TECHNIQUE: Multidetector CT imaging of the abdomen and pelvis was performed using the standard protocol following bolus administration of intravenous contrast.  CONTRAST:  87m OMNIPAQUE IOHEXOL 300 MG/ML  SOLN  COMPARISON:  CT of the abdomen and pelvis 12/25/2011.  FINDINGS: Lung Bases: Unremarkable.  Abdomen/Pelvis: Multiple low-attenuation liver lesions, similar to the prior examination. Largest of these are compatible with simple cysts, with the largest lesion in segment 2 measuring up to 15 mm in diameter. The appearance of the gallbladder, pancreas, spleen, bilateral adrenal glands and right kidney is unremarkable. There are 2 intermediate attenuation lesions in the left kidney, which appear similar to the prior study, likely to represent proteinaceous cysts, largest of which measures 19 mm in the interpolar region. Probable 2 mm nonobstructive calculus in the interpolar collecting system of the left kidney, similar to the prior study.  No significant volume of ascites. No pneumoperitoneum. No pathologic distention of small bowel. No lymphadenopathy identified within the abdomen or pelvis. Prostate gland and urinary bladder are unremarkable in appearance.  Musculoskeletal: There is a large amount of soft tissue stranding throughout the subcutaneous fat of the lower left anterior abdominal wall extending into the left flank. At this time, no definite well-defined rim enhancing fluid collection is identified to suggest presence of an abscess. There are no aggressive appearing lytic or blastic lesions noted in the visualized portions of the skeleton.  IMPRESSION: 1. Extensive inflammation in the subcutaneous fat of the lower left anterior abdominal wall and left flank. Findings appear compatible with cellulitis and possible phlegmon, but no definite abscess is identified at this time. 2. No acute intra-abdominal process. 3. Additional incidental  findings, as above, similar prior studies.   Electronically Signed   By: DVinnie LangtonM.D.   On: 06/03/2013 23:17    Review of Systems  Constitutional: Positive for fever. Negative for chills and diaphoresis.  Respiratory: Negative for cough.   Cardiovascular: Negative for chest pain.  Gastrointestinal: Positive for vomiting and abdominal pain (Superficial). Negative for nausea.  Genitourinary: Negative for dysuria, urgency, frequency and flank pain.  Musculoskeletal: Negative for myalgias.  Skin: Positive for itching and rash.   Blood pressure 113/68, pulse 109, temperature 99.6 F (37.6 C), temperature source Oral, resp. rate 18, height 5' 6"  (1.676 m), weight 159 lb 12.8 oz (72.485 kg), SpO2  96.00%. Physical Exam  Constitutional: He appears well-developed and well-nourished. No distress.  HENT:  Head: Normocephalic and atraumatic.  Eyes: Right eye exhibits no discharge. Left eye exhibits no discharge.  Neck: Normal range of motion. Neck supple.  Cardiovascular: Normal rate, regular rhythm and normal heart sounds.  Exam reveals no gallop and no friction rub.   No murmur heard. Respiratory: Effort normal and breath sounds normal. No respiratory distress. He has no wheezes. He has no rales. He exhibits no tenderness.  GI: Soft. Bowel sounds are normal. He exhibits no distension. There is tenderness (LLQ) in the left lower quadrant. There is no rebound.    Erythema has spread beyond initial border marking anteriorly approximately 1-2cm. It has spread posteriorly extensively onto the flank and lateral back. It is warm but not TTP or indurated.  Musculoskeletal: Normal range of motion.  Lymphadenopathy:       Right: No inguinal adenopathy present.       Left: Inguinal adenopathy present.  Neurological: He is alert.  Skin: He is not diaphoretic.  Psychiatric: His mood appears anxious.    Assessment/Plan: LLQ cellulitis -- Agree with initial abx choice of Vanc/Zosyn. The decrease  in his leukocytosis from last night and his defervescence is reassuring but the extent of erythematous spread is alarming. I have asked nursing to remark borders with date/time. There is nothing on my exam to indicate fluctuance in need of surgical drainage or fasciitis. We will follow along with you. Thank you for this consult.    Lisette Abu, PA-C Pager: [phone] General Trauma PA Pager: [phone] 06/04/2013, 11:03 AM

## 2013-06-04 NOTE — Consult Note (Signed)
Cellulitis from left lower abdominal wall extending around to his left flank. Erythema has receded slightly from last marking.  No localized fluctuance. Continue IV antibiotics. We will follow with you in case he should develop drainable collection. Plan was discussed in detail with the patient. Patient examined and I agree with the assessment and plan  Georganna Skeans, MD, MPH, FACS Trauma: [phone] General Surgery: [phone]  06/04/2013 7:36 PM

## 2013-06-04 NOTE — Progress Notes (Signed)
INITIAL NUTRITION ASSESSMENT  DOCUMENTATION CODES Per approved criteria  -Not Applicable   INTERVENTION:  Resource Breeze BID, each supplement provides 250 kcal and 9 grams protein  NUTRITION DIAGNOSIS: Inadequate oral intake related to abdominal pain and vomiting as evidenced by patient report of PO intake.   Goal: Patient to meet >/= 90% of estimated nutrition needs  Monitor:  PO intake and supplement acceptance, weight trends, labs, I/Os  Reason for Assessment: Malnutrition Screening Tool  43 y.o. male  Admitting Dx: Sepsis  ASSESSMENT: 43 y.o. male with no significant past medical history started developing a small pimple-like lesion in the left lower quadrant 3 days ago. This soon becoming bigger and painful. Patient also was having fever and chills. In the ER patient was found to have significant leukocytosis and CT abdomen and pelvis was done which shows extensive inflammation of the left lower quadrant with possible phlegmon. ER physician had tried to I&D but there was no abscess draining. Patient has been admitted for IV antibiotics. Patient otherwise denies any chest pain shortness of breath nausea vomiting diarrhea. Patient does not recall having any trauma or insect bite to the area. Has not had any previous episodes similar. Patient states his last tetanus immunization was 5 years ago.   Patient reported eating well PTA. Patient stated he started vomiting yesterday and has been unable to eat since then. Patient has been drinking ginger ale and tried bites of food today. Patient said he was advancing his intake slowly so his stomach doesn't get upset. Patient was also very tired and explained that he didn't get any sleep last night. Patient was receptive to [employer] to help supplement his poor PO intake.  Patient was unsure of any recent weight loss but reported his usual body weight to be 160-165 lb. Patient's current weight is 159 lb which is an insignificant  weight loss. Patient appeared well nourished upon physical exam.   Nutrition Focused Physical Exam:  Subcutaneous Fat:  Orbital Region: WNL Upper Arm Region: WNL Thoracic and Lumbar Region: N/A  Muscle:  Temple Region: WNL Clavicle Bone Region: mild depletion Clavicle and Acromion Bone Region: WNL Scapular Bone Region: N/A Dorsal Hand: WNL Patellar Region: WNL Anterior Thigh Region: WNL Posterior Calf Region: WNL  Edema: none noted   Height: Ht Readings from Last 1 Encounters:  06/04/13 5\' 6"  (1.676 m)    Weight: Wt Readings from Last 1 Encounters:  06/04/13 159 lb 12.8 oz (72.485 kg)    Ideal Body Weight: 142 lb (64.5 kg)  % Ideal Body Weight: 112%  Wt Readings from Last 10 Encounters:  06/04/13 159 lb 12.8 oz (72.485 kg)  10/01/11 169 lb (76.658 kg)    Usual Body Weight: 160-165 lb  % Usual Body Weight: 96%  BMI:  Body mass index is 25.8 kg/(m^2).  Estimated Nutritional Needs: Kcal: 1800-2000 Protein: 80-90 grams Fluid: 1.8-2.0 L  Skin: left, lower abdominal wound  Diet Order: General  EDUCATION NEEDS: -No education needs identified at this time   Intake/Output Summary (Last 24 hours) at 06/04/13 0928 Last data filed at 06/04/13 0845  Gross per 24 hour  Intake 1336.67 ml  Output      0 ml  Net 1336.67 ml    Last BM: PTA   Labs:   Recent Labs Lab 06/03/13 2014 06/04/13 0630  NA 140 138  K 4.8 4.3  CL 97 100  CO2 27 25  BUN 17 13  CREATININE 1.07 1.06  CALCIUM 9.4 8.0*  GLUCOSE 112* 104*    CBG (last 3)  No results found for this basename: GLUCAP,  in the last 72 hours  Scheduled Meds: . piperacillin-tazobactam (ZOSYN)  IV  3.375 g Intravenous Q8H  . vancomycin  1,250 mg Intravenous Q12H    Continuous Infusions: . sodium chloride 100 mL/hr (06/04/13 0138)    Past Medical History  Diagnosis Date  . Medical history non-contributory     Past Surgical History  Procedure Laterality Date  . No past surgeries       Claudell Kyle, Dietetic Intern Pager: [phone]

## 2013-06-04 NOTE — Progress Notes (Signed)
Pt spiked temp 102.3 , blood cultures drawn this am and pt on vanc and zosyn. Paged Dr. Tyler Pita

## 2013-06-04 NOTE — Progress Notes (Signed)
Subjective: Patient seen and examined, admitted with cellulitis, wbc is improving but has worsening erythema. On vancomycin and zosyn. Filed Vitals:   06/04/13 0844  BP: 113/68  Pulse: 109  Temp: 99.6 F (37.6 C)  Resp: 18    Chest: Clear Bilaterally Heart : S1S2 RRR Abdomen: Soft, positive tenderness of abdominal wall on the left lower quadrant, skin induration noted, no fluctuance Ext : No edema Neuro: Alert, oriented x 3  A/P  Cellulitis continue with vanc and zosyn Surgery consult for possible I and [place] [facility] [phone]

## 2013-06-04 NOTE — Progress Notes (Signed)
ANTIBIOTIC CONSULT NOTE - INITIAL  Pharmacy Consult for vancomycin and zosyn Indication: cellulitis  No Known Allergies  Patient Measurements: Weight: 158 lb (71.668 kg)   Vital Signs: Temp: 100.1 F (37.8 C) (03/26 2308) Temp src: Oral (03/26 2308) BP: 107/70 mmHg (03/27 0030) Pulse Rate: 122 (03/27 0030) Intake/Output from previous day:   Intake/Output from this shift:    Labs:  Recent Labs  06/03/13 2014  WBC 21.8*  HGB 14.9  PLT 228  CREATININE 1.07   The CrCl is unknown because both a height and weight (above a minimum accepted value) are required for this calculation. No results found for this basename: VANCOTROUGH, VANCOPEAK, VANCORANDOM, GENTTROUGH, GENTPEAK, GENTRANDOM, TOBRATROUGH, TOBRAPEAK, TOBRARND, AMIKACINPEAK, AMIKACINTROU, AMIKACIN,  in the last 72 hours   Microbiology: No results found for this or any previous visit (from the past 720 hour(s)).  Medical History: Past Medical History  Diagnosis Date  . Medical history non-contributory     Medications:  Prescriptions prior to admission  Medication Sig Dispense Refill  . acetaminophen (TYLENOL) 500 MG tablet Take 1,000 mg by mouth every 6 (six) hours as needed. For pain       Assessment: 43 yo man to start broad spectrum antibiotics for cellulitis.  His CrCl >90 ml/min.  WBC 21.8, Tmax 101.6  Goal of Therapy:  Vancomycin trough level 10-15 mcg/ml  Plan:  Zosyn 3.375 gm IV q8 hours Vancomycin 1250 mg IV q12 hours F/u renal function, cultures and clinical course Vanc trough when appropriate  Thanks for allowing pharmacy to be a part of this patient's care.  Excell Seltzer, PharmD Clinical Pharmacist, [phone] 06/04/2013,1:05 AM

## 2013-06-04 NOTE — Progress Notes (Signed)
Pt concerned redness has moved laterally around left side since I&D yesterday.  Advised Dr. Lisabeth Devoid on floor and he stated he would contact surgeon

## 2013-06-04 NOTE — H&P (Signed)
[facility] History and Physical  Roberto Fisher YBO:[id number] DOB: 06/11/1970 DOA: 06/03/2013  Referring physician: ER physician. PCP: Default, Provider, MD  Chief Complaint: Left lower quadrant swelling and pain.  HPI: Roberto Fisher is a 43 y.o. male with no significant past medical history started developing a small pimple-like lesion in the left lower quadrant 3 days ago. This soon becoming bigger and painful. Patient also was having fever and chills. In the ER patient was found to have significant leukocytosis and CT abdomen and pelvis was done which shows extensive inflammation of the left lower quadrant with possible phlegmon. ER physician had tried to I&D but there was no abscess draining. Patient has been admitted for IV antibiotics. Patient otherwise denies any chest pain shortness of breath nausea vomiting diarrhea. Patient does not recall having any trauma or insect bite to the area. Has not had any previous episodes similar. Patient states his last tetanus immunization was 5 years ago.   Review of Systems: As presented in the history of presenting illness, rest negative.  Past Medical History  Diagnosis Date  . Medical history non-contributory    Past Surgical History  Procedure Laterality Date  . No past surgeries     Social History:  reports that he has never smoked. He does not have any smokeless tobacco history on file. He reports that he does not drink alcohol or use illicit drugs. Where does patient live home. Can patient participate in ADLs? Yes.  No Known Allergies  Family History:  Family History  Problem Relation Age of Onset  . Hypertension Mother   . Diabetes Mellitus II Father       Prior to Admission medications   Medication Sig Start Date End Date Taking? Authorizing Provider  acetaminophen (TYLENOL) 500 MG tablet Take 1,000 mg by mouth every 6 (six) hours as needed. For pain   Yes Historical Provider, MD    Physical Exam: Filed  Vitals:   06/03/13 2115 06/03/13 2308 06/04/13 0000 06/04/13 0030  BP: 107/91 102/61 109/75 107/70  Pulse: 119 114 121 122  Temp:  100.1 F (37.8 C)    TempSrc:  Oral    Resp:  18    Weight:      SpO2: 98% 94% 94% 96%     General:  Well-developed and nourished.  Eyes: Anicteric no pallor.  ENT: No discharge from ears eyes nose mouth.  Neck: No mass felt.  Cardiovascular: S1-S2 heard.  Respiratory: No rhonchi crepitations.  Abdomen: Patient has left lower quadrant erythema with swelling around 10 cm with the duration and pain. Incision mark see.  Skin: See abdomen description.  Musculoskeletal: No edema.  Psychiatric: Appears normal.  Neurologic: Alert awake oriented to time place and person. Moves all extremities.  Labs on Admission:  Basic Metabolic Panel:  Recent Labs Lab 06/03/13 2014  NA 140  K 4.8  CL 97  CO2 27  GLUCOSE 112*  BUN 17  CREATININE 1.07  CALCIUM 9.4   Liver Function Tests: No results found for this basename: AST, ALT, ALKPHOS, BILITOT, PROT, ALBUMIN,  in the last 168 hours No results found for this basename: LIPASE, AMYLASE,  in the last 168 hours No results found for this basename: AMMONIA,  in the last 168 hours CBC:  Recent Labs Lab 06/03/13 2014  WBC 21.8*  NEUTROABS 19.3*  HGB 14.9  HCT 43.3  MCV 83.9  PLT 228   Cardiac Enzymes: No results found for this basename: CKTOTAL, CKMB, CKMBINDEX, TROPONINI,  in the last 168 hours  BNP (last 3 results) No results found for this basename: PROBNP,  in the last 8760 hours CBG: No results found for this basename: GLUCAP,  in the last 168 hours  Radiological Exams on Admission: Dg Chest 2 View  06/03/2013   CLINICAL DATA:  Fever.  EXAM: CHEST  2 VIEW  COMPARISON:  None.  FINDINGS: The heart size and mediastinal contours are within normal limits. Both lungs are clear. The visualized skeletal structures are unremarkable.  IMPRESSION: No active cardiopulmonary disease.    Electronically Signed   By: Marcello Moores  Register   On: 06/03/2013 21:56   Ct Abdomen Pelvis W Contrast  06/03/2013   CLINICAL DATA:  Left lower quadrant abdominal pain.  EXAM: CT ABDOMEN AND PELVIS WITH CONTRAST  TECHNIQUE: Multidetector CT imaging of the abdomen and pelvis was performed using the standard protocol following bolus administration of intravenous contrast.  CONTRAST:  58mL OMNIPAQUE IOHEXOL 300 MG/ML  SOLN  COMPARISON:  CT of the abdomen and pelvis 12/25/2011.  FINDINGS: Lung Bases: Unremarkable.  Abdomen/Pelvis: Multiple low-attenuation liver lesions, similar to the prior examination. Largest of these are compatible with simple cysts, with the largest lesion in segment 2 measuring up to 15 mm in diameter. The appearance of the gallbladder, pancreas, spleen, bilateral adrenal glands and right kidney is unremarkable. There are 2 intermediate attenuation lesions in the left kidney, which appear similar to the prior study, likely to represent proteinaceous cysts, largest of which measures 19 mm in the interpolar region. Probable 2 mm nonobstructive calculus in the interpolar collecting system of the left kidney, similar to the prior study.  No significant volume of ascites. No pneumoperitoneum. No pathologic distention of small bowel. No lymphadenopathy identified within the abdomen or pelvis. Prostate gland and urinary bladder are unremarkable in appearance.  Musculoskeletal: There is a large amount of soft tissue stranding throughout the subcutaneous fat of the lower left anterior abdominal wall extending into the left flank. At this time, no definite well-defined rim enhancing fluid collection is identified to suggest presence of an abscess. There are no aggressive appearing lytic or blastic lesions noted in the visualized portions of the skeleton.  IMPRESSION: 1. Extensive inflammation in the subcutaneous fat of the lower left anterior abdominal wall and left flank. Findings appear compatible with  cellulitis and possible phlegmon, but no definite abscess is identified at this time. 2. No acute intra-abdominal process. 3. Additional incidental findings, as above, similar prior studies.   Electronically Signed   By: Vinnie Langton M.D.   On: 06/03/2013 23:17     Assessment/Plan Principal Problem:   Sepsis Active Problems:   Cellulitis of left abdominal wall   1. Sepsis secondary to abdominal wall cellulitis - patient has been placed on vancomycin and Zosyn. Follow blood cultures. If patient's pain does not improve I will consult surgery for further recommendations and possible debridement if necessary. Continue with aggressive IV hydration and pain relief medications. 2. Leukocytosis - from #1.    Code Status: Full code.  Family Communication: None.  Disposition Plan: Admit to inpatient.    Syanne Looney N. [facility] Pager [phone].  If 7PM-7AM, please contact night-coverage [email or web address] Password [email or web address] 06/04/2013, 1:03 AM

## 2013-06-04 NOTE — Progress Notes (Signed)
New Admission Note:  Arrival Method: via bed with nurse Tech Mental Orientation: Alert and orientedx4 Telemetry: N/A Assessment: Completed Pain: Denies any pain Safety Measures: Safety Fall Prevention Plan was given, discussed and signed. Admission: Completed [facility] [address] Orientation: Patient has been orientated to the room, unit and the staff. Family: Significant other at bedside  Orders have been reviewed and implemented. Will continue to monitor the patient. Call light has been placed within reach and bed alarm has been activated.   Leandro Reasoner BSN, RN  Phone Number: [phone] [facility]

## 2013-06-05 LAB — CBC
HEMATOCRIT: 34.5 % — AB (ref 39.0–52.0)
HEMOGLOBIN: 11.6 g/dL — AB (ref 13.0–17.0)
MCH: 28.4 pg (ref 26.0–34.0)
MCHC: 33.6 g/dL (ref 30.0–36.0)
MCV: 84.4 fL (ref 78.0–100.0)
Platelets: 196 10*3/uL (ref 150–400)
RBC: 4.09 MIL/uL — AB (ref 4.22–5.81)
RDW: 12.5 % (ref 11.5–15.5)
WBC: 18.2 10*3/uL — ABNORMAL HIGH (ref 4.0–10.5)

## 2013-06-05 MED ORDER — DIPHENHYDRAMINE HCL 25 MG PO CAPS: 25.0000 mg | ORAL_CAPSULE | Freq: Four times a day (QID) | ORAL | Status: DC | PRN

## 2013-06-05 MED ORDER — GUAIFENESIN-DM 100-10 MG/5ML PO SYRP
5.0000 mL | ORAL_SOLUTION | ORAL | Status: DC | PRN
  Administered 2013-06-05 – 2013-06-06 (×4): 5 mL via ORAL
  Filled 2013-06-05 (×6): qty 5

## 2013-06-05 MED ORDER — DIPHENHYDRAMINE HCL 25 MG PO CAPS
25.0000 mg | ORAL_CAPSULE | Freq: Once | ORAL | Status: AC
  Administered 2013-06-05: 25 mg via ORAL
  Filled 2013-06-05: qty 1

## 2013-06-05 NOTE — Progress Notes (Signed)
[facility] PROGRESS NOTE  Roberto Fisher:[id number] DOB: 27-Jun-1970 DOA: 06/03/2013 PCP: Default, Provider, MD  Assessment/Plan:  Cellulitis- Improving, continue with vancomycin and zosyn. Surgery is following Possible I and D in am.   Code Status: Full code Family Communication: *discussed with wife at the bedside Disposition Plan: *Home when stable   Consultants:  Surgery  Procedures:  None  Antibiotics:  Vancomycin 3/27  Zosyn 3/27  HPI/Subjective: Patient seen and examined, admitted with cellulitis.Improving, says the pain is better.  Objective: Filed Vitals:   06/05/13 0835  BP: 104/61  Pulse: 71  Temp: 98.1 F (36.7 C)  Resp: 16    Intake/Output Summary (Last 24 hours) at 06/05/13 1336 Last data filed at 06/05/13 1147  Gross per 24 hour  Intake    990 ml  Output      0 ml  Net    990 ml   Filed Weights   06/03/13 1902 06/04/13 0106  Weight: 71.668 kg (158 lb) 72.485 kg (159 lb 12.8 oz)    Exam:  Physical Exam: Head: Normocephalic, atraumatic.  Eyes: No signs of jaundice, EOMI Nose: Mucous membranes dry.  Throat: Oropharynx nonerythematous, no exudate appreciated.  Neck: supple,No deformities, masses, or tenderness noted. Lungs: Normal respiratory effort. B/L Clear to auscultation, no crackles or wheezes.  Heart: Regular RR. S1 and S2 normal  Abdomen: BS normoactive. Soft, Nondistended, tenderness to palpation in teh left lower quadrant, erythema is now improving  Extremities: No pretibial edema, no erythema   Data Reviewed: Basic Metabolic Panel:  Recent Labs Lab 06/03/13 2014 06/04/13 0630  NA 140 138  K 4.8 4.3  CL 97 100  CO2 27 25  GLUCOSE 112* 104*  BUN 17 13  CREATININE 1.07 1.06  CALCIUM 9.4 8.0*   Liver Function Tests:  Recent Labs Lab 06/04/13 0630  AST 13  ALT 9  ALKPHOS 66  BILITOT 1.2  PROT 6.4  ALBUMIN 2.7*   No results found for this basename: LIPASE, AMYLASE,  in the last 168  hours No results found for this basename: AMMONIA,  in the last 168 hours CBC:  Recent Labs Lab 06/03/13 2014 06/04/13 0630 06/04/13 1646 06/05/13 0549  WBC 21.8* 18.1* 20.0* 18.2*  NEUTROABS 19.3* 15.7*  --   --   HGB 14.9 12.7* 12.3* 11.6*  HCT 43.3 37.4* 36.2* 34.5*  MCV 83.9 83.7 84.2 84.4  PLT 228 190 210 196   Cardiac Enzymes: No results found for this basename: CKTOTAL, CKMB, CKMBINDEX, TROPONINI,  in the last 168 hours BNP (last 3 results) No results found for this basename: PROBNP,  in the last 8760 hours CBG: No results found for this basename: GLUCAP,  in the last 168 hours  Recent Results (from the past 240 hour(s))  CULTURE, BLOOD (ROUTINE X 2)     Status: None   Collection Time    06/04/13 10:46 AM      Result Value Ref Range Status   Specimen Description BLOOD LEFT ANTECUBITAL   Final   Special Requests BOTTLES DRAWN AEROBIC ONLY 2.5CC   Final   Culture  Setup Time     Final   Value: 06/04/2013 14:15     Performed at [facility]   Culture     Final   Value:        BLOOD CULTURE RECEIVED NO GROWTH TO DATE CULTURE WILL BE HELD FOR 5 DAYS BEFORE ISSUING A FINAL NEGATIVE REPORT     Performed at [facility]   Report Status PENDING   Incomplete  CULTURE, BLOOD (ROUTINE X 2)     Status: None   Collection Time    06/04/13 10:58 AM      Result Value Ref Range Status   Specimen Description BLOOD LEFT ARM   Final   Special Requests BOTTLES DRAWN AEROBIC ONLY 3CC   Final   Culture  Setup Time     Final   Value: 06/04/2013 14:15     Performed at [facility]   Culture     Final   Value:        BLOOD CULTURE RECEIVED NO GROWTH TO DATE CULTURE WILL BE HELD FOR 5 DAYS BEFORE ISSUING A FINAL NEGATIVE REPORT     Performed at [facility]   Report Status PENDING   Incomplete     Studies: Dg Chest 2 View  06/03/2013   CLINICAL DATA:  Fever.  EXAM: CHEST  2 VIEW  COMPARISON:  None.  FINDINGS: The heart size and mediastinal contours  are within normal limits. Both lungs are clear. The visualized skeletal structures are unremarkable.  IMPRESSION: No active cardiopulmonary disease.   Electronically Signed   By: Marcello Moores  Register   On: 06/03/2013 21:56   Ct Abdomen Pelvis W Contrast  06/03/2013   CLINICAL DATA:  Left lower quadrant abdominal pain.  EXAM: CT ABDOMEN AND PELVIS WITH CONTRAST  TECHNIQUE: Multidetector CT imaging of the abdomen and pelvis was performed using the standard protocol following bolus administration of intravenous contrast.  CONTRAST:  47mL OMNIPAQUE IOHEXOL 300 MG/ML  SOLN  COMPARISON:  CT of the abdomen and pelvis 12/25/2011.  FINDINGS: Lung Bases: Unremarkable.  Abdomen/Pelvis: Multiple low-attenuation liver lesions, similar to the prior examination. Largest of these are compatible with simple cysts, with the largest lesion in segment 2 measuring up to 15 mm in diameter. The appearance of the gallbladder, pancreas, spleen, bilateral adrenal glands and right kidney is unremarkable. There are 2 intermediate attenuation lesions in the left kidney, which appear similar to the prior study, likely to represent proteinaceous cysts, largest of which measures 19 mm in the interpolar region. Probable 2 mm nonobstructive calculus in the interpolar collecting system of the left kidney, similar to the prior study.  No significant volume of ascites. No pneumoperitoneum. No pathologic distention of small bowel. No lymphadenopathy identified within the abdomen or pelvis. Prostate gland and urinary bladder are unremarkable in appearance.  Musculoskeletal: There is a large amount of soft tissue stranding throughout the subcutaneous fat of the lower left anterior abdominal wall extending into the left flank. At this time, no definite well-defined rim enhancing fluid collection is identified to suggest presence of an abscess. There are no aggressive appearing lytic or blastic lesions noted in the visualized portions of the skeleton.   IMPRESSION: 1. Extensive inflammation in the subcutaneous fat of the lower left anterior abdominal wall and left flank. Findings appear compatible with cellulitis and possible phlegmon, but no definite abscess is identified at this time. 2. No acute intra-abdominal process. 3. Additional incidental findings, as above, similar prior studies.   Electronically Signed   By: Vinnie Langton M.D.   On: 06/03/2013 23:17    Scheduled Meds: . feeding supplement (RESOURCE BREEZE)  1 Container Oral BID BM  . piperacillin-tazobactam (ZOSYN)  IV  3.375 g Intravenous Q8H  . vancomycin  1,250 mg Intravenous Q12H   Continuous Infusions:   Principal Problem:   Sepsis Active Problems:   Cellulitis of left  abdominal wall    Time spent: *25 min    [facility]  [facility] Pager [phone]*. If 7PM-7AM, please contact night-coverage at [email or web address], password [facility] 06/05/2013, 1:36 PM  LOS: 2 days

## 2013-06-05 NOTE — Progress Notes (Signed)
Subjective: Feels better. Not much pain. Spiking fevers.   Objective: Vital signs in last 24 hours: Temp:  [98.1 F (36.7 C)-102.6 F (39.2 C)] 98.1 F (36.7 C) (03/28 0835) Pulse Rate:  [71-117] 71 (03/28 0835) Resp:  [16-18] 16 (03/28 0835) BP: (104-118)/(60-73) 104/61 mmHg (03/28 0835) SpO2:  [92 %-98 %] 98 % (03/28 0835) Last BM Date: 06/05/13  Intake/Output from previous day: 03/27 0701 - 03/28 0700 In: 580 [P.O.:180; I.V.:400] Out: -  Intake/Output this shift: Total I/O In: 530 [P.O.:480; IV Piggyback:50] Out: -   Alert, nontoxic, nad Left abd - cellullitis doesn't appear to have spread beyond pen marks. Still with fair amount of induration around LLQ sealed incision. No crepitus. Don't appreciate any fluctuance.   Lab Results:   Recent Labs  06/04/13 1646 06/05/13 0549  WBC 20.0* 18.2*  HGB 12.3* 11.6*  HCT 36.2* 34.5*  PLT 210 196   BMET  Recent Labs  06/03/13 2014 06/04/13 0630  NA 140 138  K 4.8 4.3  CL 97 100  CO2 27 25  GLUCOSE 112* 104*  BUN 17 13  CREATININE 1.07 1.06  CALCIUM 9.4 8.0*   PT/INR No results found for this basename: LABPROT, INR,  in the last 72 hours ABG No results found for this basename: PHART, PCO2, PO2, HCO3,  in the last 72 hours  Studies/Results: Dg Chest 2 View  06/03/2013   CLINICAL DATA:  Fever.  EXAM: CHEST  2 VIEW  COMPARISON:  None.  FINDINGS: The heart size and mediastinal contours are within normal limits. Both lungs are clear. The visualized skeletal structures are unremarkable.  IMPRESSION: No active cardiopulmonary disease.   Electronically Signed   By: Marcello Moores  Register   On: 06/03/2013 21:56   Ct Abdomen Pelvis W Contrast  06/03/2013   CLINICAL DATA:  Left lower quadrant abdominal pain.  EXAM: CT ABDOMEN AND PELVIS WITH CONTRAST  TECHNIQUE: Multidetector CT imaging of the abdomen and pelvis was performed using the standard protocol following bolus administration of intravenous contrast.  CONTRAST:  55mL  OMNIPAQUE IOHEXOL 300 MG/ML  SOLN  COMPARISON:  CT of the abdomen and pelvis 12/25/2011.  FINDINGS: Lung Bases: Unremarkable.  Abdomen/Pelvis: Multiple low-attenuation liver lesions, similar to the prior examination. Largest of these are compatible with simple cysts, with the largest lesion in segment 2 measuring up to 15 mm in diameter. The appearance of the gallbladder, pancreas, spleen, bilateral adrenal glands and right kidney is unremarkable. There are 2 intermediate attenuation lesions in the left kidney, which appear similar to the prior study, likely to represent proteinaceous cysts, largest of which measures 19 mm in the interpolar region. Probable 2 mm nonobstructive calculus in the interpolar collecting system of the left kidney, similar to the prior study.  No significant volume of ascites. No pneumoperitoneum. No pathologic distention of small bowel. No lymphadenopathy identified within the abdomen or pelvis. Prostate gland and urinary bladder are unremarkable in appearance.  Musculoskeletal: There is a large amount of soft tissue stranding throughout the subcutaneous fat of the lower left anterior abdominal wall extending into the left flank. At this time, no definite well-defined rim enhancing fluid collection is identified to suggest presence of an abscess. There are no aggressive appearing lytic or blastic lesions noted in the visualized portions of the skeleton.  IMPRESSION: 1. Extensive inflammation in the subcutaneous fat of the lower left anterior abdominal wall and left flank. Findings appear compatible with cellulitis and possible phlegmon, but no definite abscess is identified at  this time. 2. No acute intra-abdominal process. 3. Additional incidental findings, as above, similar prior studies.   Electronically Signed   By: Vinnie Langton M.D.   On: 06/03/2013 23:17    Anti-infectives: Anti-infectives   Start     Dose/Rate Route Frequency Ordered Stop   06/04/13 0130  vancomycin  (VANCOCIN) 1,250 mg in sodium chloride 0.9 % 250 mL IVPB     1,250 mg 166.7 mL/hr over 90 Minutes Intravenous Every 12 hours 06/04/13 0111     06/04/13 0130  piperacillin-tazobactam (ZOSYN) IVPB 3.375 g     3.375 g 12.5 mL/hr over 240 Minutes Intravenous Every 8 hours 06/04/13 0111     06/03/13 2015  clindamycin (CLEOCIN) IVPB 900 mg     900 mg 100 mL/hr over 30 Minutes Intravenous  Once 06/03/13 2013 06/03/13 2156      Assessment/Plan: LLQ abd wall and flank cellulitis/induration  Cont IV abx Wbc stable but still spiking fevers which is concerning.  Will re-evaluate in am. May ultimately need LLQ abd wall induration opened.   Leighton Ruff. Redmond Pulling, MD, FACS General, Bariatric, & Minimally Invasive Surgery [facility], [place]   LOS: 2 days    Gayland Curry 06/05/2013

## 2013-06-05 NOTE — Progress Notes (Signed)
Patient requesting something for cough.  Fredirick Maudlin, NP notified.

## 2013-06-06 ENCOUNTER — Inpatient Hospital Stay (HOSPITAL_COMMUNITY): Payer: Self-pay | Admitting: Anesthesiology

## 2013-06-06 ENCOUNTER — Encounter (HOSPITAL_COMMUNITY): Payer: MEDICAID | Admitting: Anesthesiology

## 2013-06-06 ENCOUNTER — Encounter (HOSPITAL_COMMUNITY)
Admission: EM | Disposition: A | Discharge status: Home / Self Care | Payer: Self-pay | Source: Home / Self Care | Attending: Family Medicine

## 2013-06-06 ENCOUNTER — Encounter (HOSPITAL_COMMUNITY): Payer: Self-pay | Admitting: Anesthesiology

## 2013-06-06 HISTORY — PX: INCISION AND DRAINAGE ABSCESS: SHX5864

## 2013-06-06 LAB — SURGICAL PCR SCREEN
MRSA, PCR: NEGATIVE
STAPHYLOCOCCUS AUREUS: NEGATIVE

## 2013-06-06 SURGERY — INCISION AND DRAINAGE, ABSCESS
Anesthesia: General | Site: Abdomen

## 2013-06-06 MED ORDER — LORAZEPAM 2 MG/ML IJ SOLN
1.0000 mg | Freq: Once | INTRAMUSCULAR | Status: AC
  Administered 2013-06-06: 1 mg via INTRAVENOUS

## 2013-06-06 MED ORDER — OXYCODONE-ACETAMINOPHEN 5-325 MG PO TABS
1.0000 | ORAL_TABLET | ORAL | Status: DC | PRN
  Administered 2013-06-06 – 2013-06-08 (×3): 1 via ORAL
  Filled 2013-06-06 (×3): qty 1

## 2013-06-06 MED ORDER — PROPOFOL 10 MG/ML IV BOLUS
INTRAVENOUS | Status: DC | PRN
  Administered 2013-06-06: 20 mg via INTRAVENOUS
  Administered 2013-06-06: 200 mg via INTRAVENOUS

## 2013-06-06 MED ORDER — 0.9 % SODIUM CHLORIDE (POUR BTL) OPTIME
TOPICAL | Status: DC | PRN
Start: 2013-06-06 — End: 2013-06-06
  Administered 2013-06-06: 400 mL

## 2013-06-06 MED ORDER — LIDOCAINE HCL (CARDIAC) 20 MG/ML IV SOLN
INTRAVENOUS | Status: DC | PRN
  Administered 2013-06-06: 100 mg via INTRAVENOUS

## 2013-06-06 MED ORDER — MIDAZOLAM HCL 5 MG/5ML IJ SOLN
INTRAMUSCULAR | Status: DC | PRN
  Administered 2013-06-06: 2 mg via INTRAVENOUS

## 2013-06-06 MED ORDER — OXYCODONE HCL 5 MG PO TABS: 5.0000 mg | ORAL_TABLET | Freq: Once | ORAL | Status: DC | PRN

## 2013-06-06 MED ORDER — FENTANYL CITRATE 0.05 MG/ML IJ SOLN
INTRAMUSCULAR | Status: DC | PRN
  Administered 2013-06-06 (×2): 25 ug via INTRAVENOUS
  Administered 2013-06-06: 100 ug via INTRAVENOUS
  Administered 2013-06-06: 50 ug via INTRAVENOUS

## 2013-06-06 MED ORDER — OXYCODONE HCL 5 MG/5ML PO SOLN: 5.0000 mg | Freq: Once | ORAL | Status: DC | PRN

## 2013-06-06 MED ORDER — ROCURONIUM BROMIDE 50 MG/5ML IV SOLN
INTRAVENOUS | Status: AC
  Filled 2013-06-06: qty 1

## 2013-06-06 MED ORDER — ONDANSETRON HCL 4 MG/2ML IJ SOLN
INTRAMUSCULAR | Status: DC | PRN
  Administered 2013-06-06: 4 mg via INTRAVENOUS

## 2013-06-06 MED ORDER — PROPOFOL 10 MG/ML IV BOLUS
INTRAVENOUS | Status: AC
  Filled 2013-06-06: qty 20

## 2013-06-06 MED ORDER — SUCCINYLCHOLINE CHLORIDE 20 MG/ML IJ SOLN
INTRAMUSCULAR | Status: DC | PRN
  Administered 2013-06-06: 120 mg via INTRAVENOUS

## 2013-06-06 MED ORDER — ONDANSETRON HCL 4 MG/2ML IJ SOLN
INTRAMUSCULAR | Status: AC
  Filled 2013-06-06: qty 2

## 2013-06-06 MED ORDER — SUCCINYLCHOLINE CHLORIDE 20 MG/ML IJ SOLN
INTRAMUSCULAR | Status: AC
  Filled 2013-06-06: qty 1

## 2013-06-06 MED ORDER — METOCLOPRAMIDE HCL 5 MG/ML IJ SOLN: 10.0000 mg | Freq: Once | INTRAMUSCULAR | Status: DC | PRN

## 2013-06-06 MED ORDER — SODIUM CHLORIDE 0.9 % IR SOLN
Status: DC | PRN
  Administered 2013-06-06: 1000 mL

## 2013-06-06 MED ORDER — LORAZEPAM 2 MG/ML IJ SOLN
INTRAMUSCULAR | Status: AC
Start: 2013-06-06 — End: 2013-06-07
  Filled 2013-06-06: qty 1

## 2013-06-06 MED ORDER — MIDAZOLAM HCL 2 MG/2ML IJ SOLN
INTRAMUSCULAR | Status: AC
  Filled 2013-06-06: qty 2

## 2013-06-06 MED ORDER — LACTATED RINGERS IV SOLN
INTRAVENOUS | Status: DC | PRN
  Administered 2013-06-06: 13:00:00 via INTRAVENOUS

## 2013-06-06 MED ORDER — LORAZEPAM 2 MG/ML IJ SOLN
INTRAMUSCULAR | Status: AC
  Filled 2013-06-06: qty 1

## 2013-06-06 MED ORDER — HYDROMORPHONE HCL PF 1 MG/ML IJ SOLN: 0.2500 mg | INTRAMUSCULAR | Status: DC | PRN

## 2013-06-06 MED ORDER — FENTANYL CITRATE 0.05 MG/ML IJ SOLN
INTRAMUSCULAR | Status: AC
  Filled 2013-06-06: qty 5

## 2013-06-06 SURGICAL SUPPLY — 32 items
BANDAGE GAUZE ELAST BULKY 4 IN (GAUZE/BANDAGES/DRESSINGS) ×3 IMPLANT
BLADE SURG ROTATE 9660 (MISCELLANEOUS) ×3 IMPLANT
BNDG GAUZE ELAST 4 BULKY (GAUZE/BANDAGES/DRESSINGS) ×3 IMPLANT
CANISTER SUCTION 2500CC (MISCELLANEOUS) ×3 IMPLANT
CO AXIAL FAN SPRAY TIP SOFT SH (MISCELLANEOUS) ×3 IMPLANT
COVER SURGICAL LIGHT HANDLE (MISCELLANEOUS) ×3 IMPLANT
DRAPE LAPAROSCOPIC ABDOMINAL (DRAPES) ×3 IMPLANT
DRAPE PED LAPAROTOMY (DRAPES) IMPLANT
DRAPE UTILITY 15X26 W/TAPE STR (DRAPE) ×6 IMPLANT
DRSG PAD ABDOMINAL 8X10 ST (GAUZE/BANDAGES/DRESSINGS) ×3 IMPLANT
ELECT REM PT RETURN 9FT ADLT (ELECTROSURGICAL) ×3
ELECTRODE REM PT RTRN 9FT ADLT (ELECTROSURGICAL) ×1 IMPLANT
GLOVE BIO SURGEON STRL SZ7 (GLOVE) ×3 IMPLANT
GLOVE BIOGEL PI IND STRL 7.5 (GLOVE) ×1 IMPLANT
GLOVE BIOGEL PI INDICATOR 7.5 (GLOVE) ×2
GOWN STRL REUS W/ TWL LRG LVL3 (GOWN DISPOSABLE) ×2 IMPLANT
GOWN STRL REUS W/TWL LRG LVL3 (GOWN DISPOSABLE) ×4
HANDPIECE INTERPULSE COAX TIP (DISPOSABLE) ×2
IV NS 1000ML (IV SOLUTION) ×2
IV NS 1000ML BAXH (IV SOLUTION) ×1 IMPLANT
KIT BASIN OR (CUSTOM PROCEDURE TRAY) ×3 IMPLANT
KIT ROOM TURNOVER OR (KITS) ×3 IMPLANT
NS IRRIG 1000ML POUR BTL (IV SOLUTION) ×3 IMPLANT
PACK GENERAL/GYN (CUSTOM PROCEDURE TRAY) ×3 IMPLANT
PAD ARMBOARD 7.5X6 YLW CONV (MISCELLANEOUS) ×3 IMPLANT
SET HNDPC FAN SPRY TIP SCT (DISPOSABLE) ×1 IMPLANT
SPONGE GAUZE 4X4 12PLY (GAUZE/BANDAGES/DRESSINGS) IMPLANT
SWAB COLLECTION DEVICE MRSA (MISCELLANEOUS) IMPLANT
TAPE CLOTH SURG 4X10 WHT LF (GAUZE/BANDAGES/DRESSINGS) ×3 IMPLANT
TOWEL OR 17X24 6PK STRL BLUE (TOWEL DISPOSABLE) ×3 IMPLANT
TOWEL OR 17X26 10 PK STRL BLUE (TOWEL DISPOSABLE) ×3 IMPLANT
TUBE ANAEROBIC SPECIMEN COL (MISCELLANEOUS) ×3 IMPLANT

## 2013-06-06 NOTE — Progress Notes (Addendum)
  Subjective: Pt reports worsening pain in LLQ abd wall. Tmax 101  Objective: Vital signs in last 24 hours: Temp:  [98.1 F (36.7 C)-101.1 F (38.4 C)] 99.5 F (37.5 C) (03/29 0439) Pulse Rate:  [71-106] 97 (03/29 0439) Resp:  [16-20] 20 (03/29 0439) BP: (104-127)/(55-88) 106/73 mmHg (03/29 0439) SpO2:  [91 %-98 %] 91 % (03/29 0439) Last BM Date: 06/05/13  Intake/Output from previous day: 03/28 0701 - 03/29 0700 In: 1310 [P.O.:960; IV Piggyback:350] Out: -  Intake/Output this shift:    Asleep, easily awakens L flank - cellulitis not spreading but still present LLQ abd wall - more indurated/tender with cellulitis, now has some mild early cellulitis at umbilicus  Lab Results:   Recent Labs  06/04/13 1646 06/05/13 0549  WBC 20.0* 18.2*  HGB 12.3* 11.6*  HCT 36.2* 34.5*  PLT 210 196   BMET  Recent Labs  06/03/13 2014 06/04/13 0630  NA 140 138  K 4.8 4.3  CL 97 100  CO2 27 25  GLUCOSE 112* 104*  BUN 17 13  CREATININE 1.07 1.06  CALCIUM 9.4 8.0*   PT/INR No results found for this basename: LABPROT, INR,  in the last 72 hours ABG No results found for this basename: PHART, PCO2, PO2, HCO3,  in the last 72 hours  Studies/Results: No results found.  Anti-infectives: Anti-infectives   Start     Dose/Rate Route Frequency Ordered Stop   06/04/13 0130  vancomycin (VANCOCIN) 1,250 mg in sodium chloride 0.9 % 250 mL IVPB     1,250 mg 166.7 mL/hr over 90 Minutes Intravenous Every 12 hours 06/04/13 0111     06/04/13 0130  piperacillin-tazobactam (ZOSYN) IVPB 3.375 g     3.375 g 12.5 mL/hr over 240 Minutes Intravenous Every 8 hours 06/04/13 0111     06/03/13 2015  clindamycin (CLEOCIN) IVPB 900 mg     900 mg 100 mL/hr over 30 Minutes Intravenous  Once 06/03/13 2013 06/03/13 2156      Assessment/Plan: LLQ abd wall induration/cellulitis extending to L flank  Pt with more pain, induration, and still spiking fevers so recommend I&D&debridement in OR. Not sure  if will get a lot of puruluence but not improving enough with abx alone  Discussed risk/benefit with pt - bleeding, infection, scarring, need for addl procedures, anesthesia issues, need to pack wound. Pt's mother was on speakerphone and appeared not to be able to hear me at times so I talked louder.   Npo Cont IV abx To OR later this morning when OR available  Went back into pt's room to give update about timing of surgery, pt's family member upset and "didn't care for my attitude this morning" and "didn't appreciate how I hovered over the pt". "i work in [employer] and your attitude was not acceptable"  i apologized several times if they felt I was rude and that was not clearly not intent. Explained entire OR course and informed them surgery would be later this morning or even early pm depending on OR availability. They requested another surgeon do the case. Explained that Dr Georgette Dover is available and they agreed to proceed with him. All questions asked and answered.  Leighton Ruff. Redmond Pulling, MD, FACS General, Bariatric, & Minimally Invasive Surgery [facility], [place]   LOS: 3 days    Gayland Curry 06/06/2013

## 2013-06-06 NOTE — Anesthesia Procedure Notes (Signed)
Procedure Name: Intubation Date/Time: 06/06/2013 12:50 PM Performed by: Carola Frost Pre-anesthesia Checklist: Patient identified, Timeout performed, Emergency Drugs available, Suction available and Patient being monitored Patient Re-evaluated:Patient Re-evaluated prior to inductionOxygen Delivery Method: Circle system utilized Preoxygenation: Pre-oxygenation with 100% oxygen Intubation Type: IV induction and Cricoid Pressure applied Ventilation: Mask ventilation without difficulty Laryngoscope Size: Mac and 4 Grade View: Grade I Tube type: Oral Tube size: 7.5 mm Number of attempts: 1 Airway Equipment and Method: Stylet Placement Confirmation: positive ETCO2,  CO2 detector,  ETT inserted through vocal cords under direct vision and breath sounds checked- equal and bilateral Secured at: 23 cm Tube secured with: Tape Dental Injury: Teeth and Oropharynx as per pre-operative assessment

## 2013-06-06 NOTE — Op Note (Signed)
Pre-op Diagnosis - abdominal wall cellulitis and possible abscess Post-op Diagnosis - abdominal wall cellulitis and abscess Procedure - incision and drainage of abdominal wall - skin, subcutaneous tissue - 15 cm2 Surgeon:  Maia Petties. Anesthesia: Gen. Endotracheal Indications: This is a 43 year old male who presents with several days of a worsening skin infection on the left side of his abdomen. He underwent a CT scan 3 days ago which showed cellulitis but no sign of abscess. However he has not responded to intravenous antibiotics. The erythema seems to be spreading. He has a central area of hard tenderness in the anterior Abdominal wall to the left of midline. We examined the patient and recommended incision and drainage for probable abscess underneath the cellulitis.  Description of procedure: The patient was brought to the operating room and placed in a supine position on the operating room table.  After an adequate level of general anesthesia was obtained, the patient's abdomen and left flank were shaved, prepped with chlor prep and draped sterile fashion. A timeout was taken to ensure the proper patient proper procedure. The cellulitis around the left flank shows some mild thickening but no hard induration. There is an area of hard induration about 7 x 12 cm anteriorly. I made a round incision in this area. We encountered some purulent fluid. This purulent fluid was cultured and sent for microbiology. We then opened up the abscess cavity. The abscess cavity tunnels medially towards the umbilicus. There are also tunnels laterally towards his flank. We open up all these tunnels bluntly. Cautery was used for hemostasis. We then used about a liter and a half of sterile saline to irrigate thoroughly. No further purulent drainage was noted. Hemostasis was obtained with cautery. We packed the wound with saline moistened gauze. An ABD dressing was applied. The patient was then extubated and brought to  recovery in stable condition. All sponge, instrument, and needle counts are correct.  Imogene Burn. Georgette Dover, MD, [facility]  General/ Trauma Surgery  06/06/2013 1:26 PM

## 2013-06-06 NOTE — Preoperative (Signed)
Beta Blockers   Reason not to administer Beta Blockers:Not Applicable 

## 2013-06-06 NOTE — Anesthesia Preprocedure Evaluation (Addendum)
Anesthesia Evaluation  Patient identified by MRN, date of birth, ID band Patient awake    Reviewed: Allergy & Precautions, H&P , NPO status , Patient's Chart, lab work & pertinent test results, reviewed documented beta blocker date and time   Airway Mallampati: II TM Distance: >3 FB Neck ROM: full    Dental  (+) Dental Advisory Given   Pulmonary neg pulmonary ROS,  breath sounds clear to auscultation        Cardiovascular negative cardio ROS  Rhythm:regular     Neuro/Psych negative neurological ROS  negative psych ROS   GI/Hepatic negative GI ROS, Neg liver ROS,   Endo/Other  negative endocrine ROS  Renal/GU negative Renal ROS  negative genitourinary   Musculoskeletal   Abdominal   Peds  Hematology negative hematology ROS (+)   Anesthesia Other Findings See surgeon's H&P   Reproductive/Obstetrics negative OB ROS                          Anesthesia Physical Anesthesia Plan  ASA: II and emergent  Anesthesia Plan: General   Post-op Pain Management:    Induction: Intravenous, Rapid sequence and Cricoid pressure planned  Airway Management Planned: Oral ETT  Additional Equipment:   Intra-op Plan:   Post-operative Plan: Extubation in OR  Informed Consent: I have reviewed the patients History and Physical, chart, labs and discussed the procedure including the risks, benefits and alternatives for the proposed anesthesia with the patient or authorized representative who has indicated his/her understanding and acceptance.   Dental Advisory Given  Plan Discussed with: CRNA and Surgeon  Anesthesia Plan Comments:         Anesthesia Quick Evaluation

## 2013-06-06 NOTE — Progress Notes (Signed)
[facility] PROGRESS NOTE  Roberto Fisher QMV:[id number] DOB: 06/10/1970 DOA: 06/03/2013 PCP: Default, Provider, MD  Assessment/Plan:  Cellulitis- Improving, continue with vancomycin and zosyn.                  Surgery planning for I&D debridement.  Anxiety-   Continue xanax prn.    Code Status: Full code Family Communication: *discussed with wife at the bedside Disposition Plan: *Home when stable   Consultants:  Surgery  Procedures:  None  Antibiotics:  Vancomycin 3/27  Zosyn 3/27  HPI/Subjective: Patient seen and examined, admitted with cellulitis.very anxious about going to OR.  Objective: Filed Vitals:   06/06/13 1203  BP: 116/73  Pulse: 82  Temp: 99 F (37.2 C)  Resp: 20    Intake/Output Summary (Last 24 hours) at 06/06/13 1207 Last data filed at 06/06/13 0845  Gross per 24 hour  Intake   1020 ml  Output      0 ml  Net   1020 ml   Filed Weights   06/03/13 1902 06/04/13 0106  Weight: 71.668 kg (158 lb) 72.485 kg (159 lb 12.8 oz)    Exam:  Physical Exam: Head: Normocephalic, atraumatic.  Eyes: No signs of jaundice, EOMI Nose: Mucous membranes dry.  Throat: Oropharynx nonerythematous, no exudate appreciated.  Neck: supple,No deformities, masses, or tenderness noted. Lungs: Normal respiratory effort. B/L Clear to auscultation, no crackles or wheezes.  Heart: Regular RR. S1 and S2 normal  Abdomen: BS normoactive. Soft, Nondistended, tenderness to palpation in teh left lower quadrant, erythema is now improving  Extremities: No pretibial edema, no erythema   Data Reviewed: Basic Metabolic Panel:  Recent Labs Lab 06/03/13 2014 06/04/13 0630  NA 140 138  K 4.8 4.3  CL 97 100  CO2 27 25  GLUCOSE 112* 104*  BUN 17 13  CREATININE 1.07 1.06  CALCIUM 9.4 8.0*   Liver Function Tests:  Recent Labs Lab 06/04/13 0630  AST 13  ALT 9  ALKPHOS 66  BILITOT 1.2  PROT 6.4  ALBUMIN 2.7*   No results found for this basename:  LIPASE, AMYLASE,  in the last 168 hours No results found for this basename: AMMONIA,  in the last 168 hours CBC:  Recent Labs Lab 06/03/13 2014 06/04/13 0630 06/04/13 1646 06/05/13 0549  WBC 21.8* 18.1* 20.0* 18.2*  NEUTROABS 19.3* 15.7*  --   --   HGB 14.9 12.7* 12.3* 11.6*  HCT 43.3 37.4* 36.2* 34.5*  MCV 83.9 83.7 84.2 84.4  PLT 228 190 210 196   Cardiac Enzymes: No results found for this basename: CKTOTAL, CKMB, CKMBINDEX, TROPONINI,  in the last 168 hours BNP (last 3 results) No results found for this basename: PROBNP,  in the last 8760 hours CBG: No results found for this basename: GLUCAP,  in the last 168 hours  Recent Results (from the past 240 hour(s))  CULTURE, BLOOD (ROUTINE X 2)     Status: None   Collection Time    06/04/13 10:46 AM      Result Value Ref Range Status   Specimen Description BLOOD LEFT ANTECUBITAL   Final   Special Requests BOTTLES DRAWN AEROBIC ONLY 2.5CC   Final   Culture  Setup Time     Final   Value: 06/04/2013 14:15     Performed at [facility]   Culture     Final   Value:        BLOOD CULTURE RECEIVED NO GROWTH TO DATE CULTURE WILL  BE HELD FOR 5 DAYS BEFORE ISSUING A FINAL NEGATIVE REPORT     Performed at [facility]   Report Status PENDING   Incomplete  CULTURE, BLOOD (ROUTINE X 2)     Status: None   Collection Time    06/04/13 10:58 AM      Result Value Ref Range Status   Specimen Description BLOOD LEFT ARM   Final   Special Requests BOTTLES DRAWN AEROBIC ONLY 3CC   Final   Culture  Setup Time     Final   Value: 06/04/2013 14:15     Performed at [facility]   Culture     Final   Value:        BLOOD CULTURE RECEIVED NO GROWTH TO DATE CULTURE WILL BE HELD FOR 5 DAYS BEFORE ISSUING A FINAL NEGATIVE REPORT     Performed at [facility]   Report Status PENDING   Incomplete     Studies: No results found.  Scheduled Meds: . feeding supplement (RESOURCE BREEZE)  1 Container Oral BID BM  .  LORazepam  1 mg Intravenous Once  . piperacillin-tazobactam (ZOSYN)  IV  3.375 g Intravenous Q8H  . vancomycin  1,250 mg Intravenous Q12H   Continuous Infusions:   Principal Problem:   Sepsis Active Problems:   Cellulitis of left abdominal wall    Time spent: *25 min    [facility] Pager [phone]*. If 7PM-7AM, please contact night-coverage at [email or web address], password [facility] 06/06/2013, 12:07 PM  LOS: 3 days

## 2013-06-06 NOTE — Anesthesia Postprocedure Evaluation (Signed)
Anesthesia Post Note  Patient: Roberto Fisher  Procedure(s) Performed: Procedure(s) (LRB): INCISION AND DRAINAGE DEBRIDEMENT OF ABDOMINAL WALL (N/A)  Anesthesia type: General  Patient location: PACU  Post pain: Pain level controlled  Post assessment: Patient's Cardiovascular Status Stable  Last Vitals:  Filed Vitals:   06/06/13 1422  BP:   Pulse:   Temp: 37.4 C  Resp:     Post vital signs: Reviewed and stable  Level of consciousness: alert  Complications: No apparent anesthesia complications

## 2013-06-06 NOTE — Transfer of Care (Signed)
Immediate Anesthesia Transfer of Care Note  Patient: Roberto Fisher  Procedure(s) Performed: Procedure(s): INCISION AND DRAINAGE DEBRIDEMENT OF ABDOMINAL WALL (N/A)  Patient Location: PACU  Anesthesia Type:General  Level of Consciousness: awake and alert   Airway & Oxygen Therapy: Patient Spontanous Breathing and Patient connected to nasal cannula oxygen  Post-op Assessment: Report given to PACU RN, Post -op Vital signs reviewed and stable and Patient moving all extremities X 4  Post vital signs: Reviewed and stable  Complications: No apparent anesthesia complications

## 2013-06-07 MED ORDER — MORPHINE SULFATE 2 MG/ML IJ SOLN
1.0000 mg | INTRAMUSCULAR | Status: DC | PRN
  Administered 2013-06-07 – 2013-06-08 (×2): 2 mg via INTRAVENOUS
  Filled 2013-06-07: qty 2
  Filled 2013-06-07 (×2): qty 1

## 2013-06-07 MED ORDER — HYDRALAZINE HCL 25 MG PO TABS
25.0000 mg | ORAL_TABLET | Freq: Four times a day (QID) | ORAL | Status: DC | PRN
  Filled 2013-06-07: qty 1

## 2013-06-07 MED ORDER — ENOXAPARIN SODIUM 40 MG/0.4ML ~~LOC~~ SOLN
40.0000 mg | SUBCUTANEOUS | Status: DC
Start: 2013-06-07 — End: 2013-06-08
  Administered 2013-06-07: 40 mg via SUBCUTANEOUS
  Filled 2013-06-07 (×2): qty 0.4

## 2013-06-07 MED ORDER — MORPHINE SULFATE 4 MG/ML IJ SOLN
4.0000 mg | Freq: Once | INTRAMUSCULAR | Status: AC
Start: 2013-06-07 — End: 2013-06-07
  Administered 2013-06-07: 4 mg via INTRAVENOUS
  Filled 2013-06-07: qty 1

## 2013-06-07 NOTE — Progress Notes (Signed)
Patient freaked out when he found out that he is getting lovenox shot into his abdomen. Explained with patient and family. Continue to refused medication. Will Update MD in am. Patient stated he is ambulating well.

## 2013-06-07 NOTE — Progress Notes (Signed)
Patient ID: Roberto Fisher, male   DOB: 1970-08-21, 43 y.o.   MRN: [id number] 1 Day Post-Op  Subjective: Pt feels ok.  No major c/o.  Objective: Vital signs in last 24 hours: Temp:  [98.4 F (36.9 C)-100.3 F (37.9 C)] 98.4 F (36.9 C) (03/30 0636) Pulse Rate:  [82-108] 83 (03/30 0636) Resp:  [17-30] 20 (03/30 0636) BP: (108-132)/(52-93) 124/76 mmHg (03/30 0636) SpO2:  [94 %-100 %] 94 % (03/30 0636) Last BM Date: 06/05/13  Intake/Output from previous day: 03/29 0701 - 03/30 0700 In: 1290 [P.O.:240; I.V.:400; IV Piggyback:650] Out: 20 [Blood:20] Intake/Output this shift:    PE: Abd: soft, cellulitis around wound is almost resolved.  He still has a large area of left sided flank cellulitis present, but is soft.  This has not retracted from the outline yet.  The more medial this is the harder it becomes.  Wound is basically clean.  There was a small amount of thin purulent drainage noted from deep within the wound, but suspect residual and no undrained areas  Lab Results:   Recent Labs  06/04/13 1646 06/05/13 0549  WBC 20.0* 18.2*  HGB 12.3* 11.6*  HCT 36.2* 34.5*  PLT 210 196   BMET No results found for this basename: NA, K, CL, CO2, GLUCOSE, BUN, CREATININE, CALCIUM,  in the last 72 hours PT/INR No results found for this basename: LABPROT, INR,  in the last 72 hours CMP     Component Value Date/Time   NA 138 06/04/2013 0630   K 4.3 06/04/2013 0630   CL 100 06/04/2013 0630   CO2 25 06/04/2013 0630   GLUCOSE 104* 06/04/2013 0630   BUN 13 06/04/2013 0630   CREATININE 1.06 06/04/2013 0630   CALCIUM 8.0* 06/04/2013 0630   PROT 6.4 06/04/2013 0630   ALBUMIN 2.7* 06/04/2013 0630   AST 13 06/04/2013 0630   ALT 9 06/04/2013 0630   ALKPHOS 66 06/04/2013 0630   BILITOT 1.2 06/04/2013 0630   GFRNONAA 84* 06/04/2013 0630   GFRAA >90 06/04/2013 0630   Lipase  No results found for this basename: lipase       Studies/Results: No results  found.  Anti-infectives: Anti-infectives   Start     Dose/Rate Route Frequency Ordered Stop   06/04/13 0130  vancomycin (VANCOCIN) 1,250 mg in sodium chloride 0.9 % 250 mL IVPB     1,250 mg 166.7 mL/hr over 90 Minutes Intravenous Every 12 hours 06/04/13 0111     06/04/13 0130  piperacillin-tazobactam (ZOSYN) IVPB 3.375 g     3.375 g 12.5 mL/hr over 240 Minutes Intravenous Every 8 hours 06/04/13 0111     06/03/13 2015  clindamycin (CLEOCIN) IVPB 900 mg     900 mg 100 mL/hr over 30 Minutes Intravenous  Once 06/03/13 2013 06/03/13 2156       Assessment/Plan  1. POD 1, s/p I&D of left sided abdominal wall cellulitis and abscess  Plan: 1. Cont IV abx therapy 2. Await cultures 3. BID dressing changes 4. May shower  5. Closely follow left flank cellulitis  LOS: 4 days    Aziel Morgan E 06/07/2013, 8:25 AM Pager: [phone]

## 2013-06-07 NOTE — Progress Notes (Addendum)
[facility] PROGRESS NOTE  Roberto Fisher INO:[id number] DOB: Jun 13, 1970 DOA: 06/03/2013 PCP: Default, Provider, MD  Assessment/Plan:  Cellulitis- Improving, continue with vancomycin and zosyn.                  S/p Incision and drainage.  Anxiety-  Continue xanax prn. Elevated Blood pressure- Hydralazine 25 mg po q 6 hr prn.    Code Status: Full code Family Communication: *discussed with wife at the bedside Disposition Plan: *Home when stable   Consultants:  Surgery  Procedures:  None  Antibiotics:  Vancomycin 3/27  Zosyn 3/27  HPI/Subjective: Patient seen and examined, admitted with cellulitis. S/p Incision and drainage.  Objective: Filed Vitals:   06/07/13 1625  BP: 199/71  Pulse: 86  Temp: 99.2 F (37.3 C)  Resp: 20    Intake/Output Summary (Last 24 hours) at 06/07/13 1630 Last data filed at 06/06/13 1839  Gross per 24 hour  Intake    890 ml  Output      0 ml  Net    890 ml   Filed Weights   06/03/13 1902 06/04/13 0106  Weight: 71.668 kg (158 lb) 72.485 kg (159 lb 12.8 oz)    Exam:  Physical Exam: Head: Normocephalic, atraumatic.  Eyes: No signs of jaundice, EOMI Nose: Mucous membranes dry.  Throat: Oropharynx nonerythematous, no exudate appreciated.  Neck: supple,No deformities, masses, or tenderness noted. Lungs: Normal respiratory effort. B/L Clear to auscultation, no crackles or wheezes.  Heart: Regular RR. S1 and S2 normal  Abdomen: BS normoactive. Soft, Nondistended, tenderness to palpation in teh left lower quadrant, erythema is now improving  Extremities: No pretibial edema, no erythema   Data Reviewed: Basic Metabolic Panel:  Recent Labs Lab 06/03/13 2014 06/04/13 0630  NA 140 138  K 4.8 4.3  CL 97 100  CO2 27 25  GLUCOSE 112* 104*  BUN 17 13  CREATININE 1.07 1.06  CALCIUM 9.4 8.0*   Liver Function Tests:  Recent Labs Lab 06/04/13 0630  AST 13  ALT 9  ALKPHOS 66  BILITOT 1.2  PROT 6.4  ALBUMIN 2.7*    No results found for this basename: LIPASE, AMYLASE,  in the last 168 hours No results found for this basename: AMMONIA,  in the last 168 hours CBC:  Recent Labs Lab 06/03/13 2014 06/04/13 0630 06/04/13 1646 06/05/13 0549  WBC 21.8* 18.1* 20.0* 18.2*  NEUTROABS 19.3* 15.7*  --   --   HGB 14.9 12.7* 12.3* 11.6*  HCT 43.3 37.4* 36.2* 34.5*  MCV 83.9 83.7 84.2 84.4  PLT 228 190 210 196   Cardiac Enzymes: No results found for this basename: CKTOTAL, CKMB, CKMBINDEX, TROPONINI,  in the last 168 hours BNP (last 3 results) No results found for this basename: PROBNP,  in the last 8760 hours CBG: No results found for this basename: GLUCAP,  in the last 168 hours  Recent Results (from the past 240 hour(s))  CULTURE, BLOOD (ROUTINE X 2)     Status: None   Collection Time    06/04/13 10:46 AM      Result Value Ref Range Status   Specimen Description BLOOD LEFT ANTECUBITAL   Final   Special Requests BOTTLES DRAWN AEROBIC ONLY 2.5CC   Final   Culture  Setup Time     Final   Value: 06/04/2013 14:15     Performed at [facility]   Culture     Final   Value:  BLOOD CULTURE RECEIVED NO GROWTH TO DATE CULTURE WILL BE HELD FOR 5 DAYS BEFORE ISSUING A FINAL NEGATIVE REPORT     Performed at [facility]   Report Status PENDING   Incomplete  CULTURE, BLOOD (ROUTINE X 2)     Status: None   Collection Time    06/04/13 10:58 AM      Result Value Ref Range Status   Specimen Description BLOOD LEFT ARM   Final   Special Requests BOTTLES DRAWN AEROBIC ONLY 3CC   Final   Culture  Setup Time     Final   Value: 06/04/2013 14:15     Performed at [facility]   Culture     Final   Value:        BLOOD CULTURE RECEIVED NO GROWTH TO DATE CULTURE WILL BE HELD FOR 5 DAYS BEFORE ISSUING A FINAL NEGATIVE REPORT     Performed at [facility]   Report Status PENDING   Incomplete  SURGICAL PCR SCREEN     Status: None   Collection Time    06/06/13  9:47 AM       Result Value Ref Range Status   MRSA, PCR NEGATIVE  NEGATIVE Final   Staphylococcus aureus NEGATIVE  NEGATIVE Final   Comment:            The Xpert SA Assay (FDA     approved for NASAL specimens     in patients over 56 years of age),     is one component of     a comprehensive surveillance     program.  Test performance has     been validated by [employer] for patients greater     than or equal to 2 year old.     It is not intended     to diagnose infection nor to     guide or monitor treatment.  CULTURE, ROUTINE-ABSCESS     Status: None   Collection Time    06/06/13  1:05 PM      Result Value Ref Range Status   Specimen Description ABSCESS ABDOMEN   Final   Special Requests PATIENT ON FOLLOWING ZOSYN VANCOMYCIN   Final   Gram Stain     Final   Value: ABUNDANT WBC PRESENT, PREDOMINANTLY PMN     NO SQUAMOUS EPITHELIAL CELLS SEEN     MODERATE GRAM POSITIVE COCCI IN PAIRS     IN CLUSTERS     Performed at [facility]   Culture     Final   Value: Culture reincubated for better growth     Performed at [facility]   Report Status PENDING   Incomplete  ANAEROBIC CULTURE     Status: None   Collection Time    06/06/13  1:05 PM      Result Value Ref Range Status   Specimen Description ABSCESS ABDOMEN   Final   Special Requests PATIENT ON FOLLOWING ZOSYN VANCOMYCIN   Final   Gram Stain     Final   Value: ABUNDANT WBC PRESENT, PREDOMINANTLY PMN     RARE SQUAMOUS EPITHELIAL CELLS PRESENT     MODERATE GRAM POSITIVE COCCI IN PAIRS     IN CLUSTERS     Performed at [facility]   Culture     Final   Value: NO ANAEROBES ISOLATED; CULTURE IN PROGRESS FOR 5 DAYS     Performed at [facility]  Report Status PENDING   Incomplete  TISSUE CULTURE     Status: None   Collection Time    06/06/13  1:16 PM      Result Value Ref Range Status   Specimen Description TISSUE ABDOMEN   Final   Special Requests TISSUE FROM ABDOMEN WALL PT ON ZOSYN VANCOMYCIN    Final   Gram Stain     Final   Value: FEW WBC PRESENT, PREDOMINANTLY PMN     FEW SQUAMOUS EPITHELIAL CELLS PRESENT     FEW GRAM POSITIVE COCCI IN PAIRS     IN CLUSTERS     Performed at [facility]   Culture     Final   Value: Culture reincubated for better growth     Performed at [facility]   Report Status PENDING   Incomplete     Studies: No results found.  Scheduled Meds: . enoxaparin (LOVENOX) injection  40 mg Subcutaneous Q24H  . feeding supplement (RESOURCE BREEZE)  1 Container Oral BID BM  . piperacillin-tazobactam (ZOSYN)  IV  3.375 g Intravenous Q8H  . vancomycin  1,250 mg Intravenous Q12H   Continuous Infusions:   Principal Problem:   Sepsis Active Problems:   Cellulitis of left abdominal wall    Time spent: *25 min    [facility] [place] [facility] Pager [phone]*. If 7PM-7AM, please contact night-coverage at [email or web address], password [facility] 06/07/2013, 4:30 PM  LOS: 4 days

## 2013-06-07 NOTE — Clinical Documentation Improvement (Signed)
Presents with Sepsis secondary to Cellulitis. Debridement noted by Medicine and Anesthesia.  Please clarify the type of debridement performed and depth of debridement (of skin, subcutaneous tissue, fascia, muscle, bone).  Key Words or Best Practice: Excisional debridement             Non excisional debridement  Query is being asked to capture an accurate picture of the patient's admission, severity of illness. Depending on response will fall to either a Surgical or Medical DRG.   Thank You,  Zoila Shutter ,RN Clinical Documentation Specialist:  [phone] ; Cell: [phone] [place] [facility]

## 2013-06-07 NOTE — Progress Notes (Signed)
Agree with above 

## 2013-06-08 ENCOUNTER — Encounter (HOSPITAL_COMMUNITY): Payer: Self-pay | Admitting: Surgery

## 2013-06-08 DIAGNOSIS — L03319 Cellulitis of trunk, unspecified: Secondary | ICD-10-CM

## 2013-06-08 DIAGNOSIS — L02219 Cutaneous abscess of trunk, unspecified: Secondary | ICD-10-CM

## 2013-06-08 DIAGNOSIS — A419 Sepsis, unspecified organism: Secondary | ICD-10-CM

## 2013-06-08 LAB — BASIC METABOLIC PANEL
BUN: 9 mg/dL (ref 6–23)
CALCIUM: 8.9 mg/dL (ref 8.4–10.5)
CO2: 26 mEq/L (ref 19–32)
Chloride: 102 mEq/L (ref 96–112)
Creatinine, Ser: 0.89 mg/dL (ref 0.50–1.35)
GLUCOSE: 81 mg/dL (ref 70–99)
POTASSIUM: 3.8 meq/L (ref 3.7–5.3)
SODIUM: 140 meq/L (ref 137–147)

## 2013-06-08 LAB — CBC
HCT: 35.8 % — ABNORMAL LOW (ref 39.0–52.0)
Hemoglobin: 11.9 g/dL — ABNORMAL LOW (ref 13.0–17.0)
MCH: 28.5 pg (ref 26.0–34.0)
MCHC: 33.2 g/dL (ref 30.0–36.0)
MCV: 85.6 fL (ref 78.0–100.0)
PLATELETS: 324 10*3/uL (ref 150–400)
RBC: 4.18 MIL/uL — ABNORMAL LOW (ref 4.22–5.81)
RDW: 12.7 % (ref 11.5–15.5)
WBC: 10.6 10*3/uL — ABNORMAL HIGH (ref 4.0–10.5)

## 2013-06-08 MED ORDER — OXYCODONE-ACETAMINOPHEN 5-325 MG PO TABS: 1.0000 | ORAL_TABLET | ORAL | Status: DC | PRN

## 2013-06-08 MED ORDER — SULFAMETHOXAZOLE-TMP DS 800-160 MG PO TABS: 1.0000 | ORAL_TABLET | Freq: Two times a day (BID) | ORAL | Status: DC

## 2013-06-08 NOTE — Progress Notes (Signed)
Patient ID: Roberto Fisher, male   DOB: 1970-08-27, 43 y.o.   MRN: [id number]   Subjective: Feeling better today.  White count is down.  Afebrile.  Walking in hallways.  Dressing change this AM with oral pain meds.  Mother will be changing dressings at home.   Objective:  Vital signs:  Filed Vitals:   06/07/13 1625 06/07/13 2110 06/08/13 0507 06/08/13 1000  BP: 199/71 132/85 131/81 124/89  Pulse: 86 92 86 88  Temp: 99.2 F (37.3 C) 98.6 F (37 C) 98.8 F (37.1 C) 98.4 F (36.9 C)  TempSrc: Oral Oral Oral Oral  Resp: 20 19 18 18   Height:      Weight:      SpO2: 98% 97% 100% 96%    Last BM Date: 06/06/13  Intake/Output   Yesterday:  03/30 0701 - 03/31 0700 In: 1310 [P.O.:360; IV Piggyback:950] Out: -  This shift:  Total I/O In: 360 [P.O.:360] Out: -    Physical Exam: Abd: soft, the wound is clean, there is no surrounding erythema.  Left flank erythema has significantly improved.  Tolerated the dressing change well.    Problem List:   Principal Problem:   Sepsis Active Problems:   Cellulitis of left abdominal wall    Results:   Labs: Results for orders placed during the hospital encounter of 06/03/13 (from the past 48 hour(s))  CULTURE, ROUTINE-ABSCESS     Status: None   Collection Time    06/06/13  1:05 PM      Result Value Ref Range   Specimen Description ABSCESS ABDOMEN     Special Requests PATIENT ON FOLLOWING ZOSYN VANCOMYCIN     Gram Stain       Value: ABUNDANT WBC PRESENT, PREDOMINANTLY PMN     NO SQUAMOUS EPITHELIAL CELLS SEEN     MODERATE GRAM POSITIVE COCCI IN PAIRS     IN CLUSTERS     Performed at [facility]   Culture       Value: ABUNDANT STAPHYLOCOCCUS AUREUS     Note: RIFAMPIN AND GENTAMICIN SHOULD NOT BE USED AS SINGLE DRUGS FOR TREATMENT OF STAPH INFECTIONS.     Performed at [facility]   Report Status PENDING    ANAEROBIC CULTURE     Status: None   Collection Time    06/06/13  1:05 PM      Result Value  Ref Range   Specimen Description ABSCESS ABDOMEN     Special Requests PATIENT ON FOLLOWING ZOSYN VANCOMYCIN     Gram Stain       Value: ABUNDANT WBC PRESENT, PREDOMINANTLY PMN     RARE SQUAMOUS EPITHELIAL CELLS PRESENT     MODERATE GRAM POSITIVE COCCI IN PAIRS     IN CLUSTERS     Performed at [facility]   Culture       Value: NO ANAEROBES ISOLATED; CULTURE IN PROGRESS FOR 5 DAYS     Performed at [facility]   Report Status PENDING    TISSUE CULTURE     Status: None   Collection Time    06/06/13  1:16 PM      Result Value Ref Range   Specimen Description TISSUE ABDOMEN     Special Requests TISSUE FROM ABDOMEN WALL PT ON ZOSYN VANCOMYCIN     Gram Stain       Value: FEW WBC PRESENT, PREDOMINANTLY PMN     FEW SQUAMOUS EPITHELIAL CELLS PRESENT     FEW Lonell Grandchild  POSITIVE COCCI IN PAIRS     IN CLUSTERS     Performed at [facility]   Culture       Value: MODERATE STAPHYLOCOCCUS AUREUS     Note: RIFAMPIN AND GENTAMICIN SHOULD NOT BE USED AS SINGLE DRUGS FOR TREATMENT OF STAPH INFECTIONS.     Performed at [facility]   Report Status PENDING    CBC     Status: Abnormal   Collection Time    06/08/13  6:35 AM      Result Value Ref Range   WBC 10.6 (*) 4.0 - 10.5 K/uL   RBC 4.18 (*) 4.22 - 5.81 MIL/uL   Hemoglobin 11.9 (*) 13.0 - 17.0 g/dL   HCT 35.8 (*) 39.0 - 52.0 %   MCV 85.6  78.0 - 100.0 fL   MCH 28.5  26.0 - 34.0 pg   MCHC 33.2  30.0 - 36.0 g/dL   RDW 12.7  11.5 - 15.5 %   Platelets 324  150 - 400 K/uL  BASIC METABOLIC PANEL     Status: None   Collection Time    06/08/13  6:35 AM      Result Value Ref Range   Sodium 140  137 - 147 mEq/L   Potassium 3.8  3.7 - 5.3 mEq/L   Chloride 102  96 - 112 mEq/L   CO2 26  19 - 32 mEq/L   Glucose, Bld 81  70 - 99 mg/dL   BUN 9  6 - 23 mg/dL   Creatinine, Ser 0.89  0.50 - 1.35 mg/dL   Calcium 8.9  8.4 - 10.5 mg/dL   GFR calc non Af Amer >90  >90 mL/min   GFR calc Af Amer >90  >90 mL/min   Comment:  (NOTE)     The eGFR has been calculated using the CKD EPI equation.     This calculation has not been validated in all clinical situations.     eGFR's persistently <90 mL/min signify possible Chronic Kidney     Disease.    Imaging / Studies: No results found.  Medications / Allergies: per chart  Antibiotics: Anti-infectives   Start     Dose/Rate Route Frequency Ordered Stop   06/04/13 0130  vancomycin (VANCOCIN) 1,250 mg in sodium chloride 0.9 % 250 mL IVPB     1,250 mg 166.7 mL/hr over 90 Minutes Intravenous Every 12 hours 06/04/13 0111     06/04/13 0130  piperacillin-tazobactam (ZOSYN) IVPB 3.375 g     3.375 g 12.5 mL/hr over 240 Minutes Intravenous Every 8 hours 06/04/13 0111     06/03/13 2015  clindamycin (CLEOCIN) IVPB 900 mg     900 mg 100 mL/hr over 30 Minutes Intravenous  Once 06/03/13 2013 06/03/13 2156     Assessment/Plan  1. POD 2, s/p I&D of left sided abdominal wall cellulitis and abscess   Plan:  BID wet to dry dressing changes, HH, patient teaching atbx per primary team bactrim or doxy. May shower  Stable for discharge from surgical standpoint Will arrange follow up  Erby Pian, [facility] Pager [phone] Office [phone]  06/08/2013 11:12 AM

## 2013-06-08 NOTE — Discharge Instructions (Signed)
Dressing Change °A dressing is a material placed over wounds. It keeps the wound clean, dry, and protected from further injury. This provides an environment that favors wound healing.  °BEFORE YOU BEGIN °· Get your supplies together. Things you may need include: °· Saline solution. °· Flexible gauze dressing. °· Medicated cream. °· Tape. °· Gloves. °· Abdominal dressing pads. °· Gauze squares. °· Plastic bags. °· Take pain medicine 30 minutes before the dressing change if you need it. °· Take a shower before you do the first dressing change of the day. Use plastic wrap or a plastic bag to prevent the dressing from getting wet. °REMOVING YOUR OLD DRESSING  °· Wash your hands with soap and water. Dry your hands with a clean towel. °· Put on your gloves. °· Remove any tape. °· Carefully remove the old dressing. If the dressing sticks, you may dampen it with warm water to loosen it, or follow your caregiver's specific directions. °· Remove any gauze or packing tape that is in your wound. °· Take off your gloves. °· Put the gloves, tape, gauze, or any packing tape into a plastic bag. °CHANGING YOUR DRESSING °· Open the supplies. °· Take the cap off the saline solution. °· Open the gauze package so that the gauze remains on the inside of the package. °· Put on your gloves. °· Clean your wound as told by your caregiver. °· If you have been told to keep your wound dry, follow those instructions. °· Your caregiver may tell you to do one or more of the following: °· Pick up the gauze. Pour the saline solution over the gauze. Squeeze out the extra saline solution. °· Put medicated cream or other medicine on your wound if you have been told to do so. °· Put the solution soaked gauze only in your wound, not on the skin around it. °· Pack your wound loosely or as told by your caregiver. °· Put dry gauze on your wound. °· Put abdominal dressing pads over the dry gauze if your wet gauze soaks through. °· Tape the abdominal dressing  pads in place so they will not fall off. Do not wrap the tape completely around the affected part (arm, leg, abdomen). °· Wrap the dressing pads with a flexible gauze dressing to secure it in place. °· Take off your gloves. Put them in the plastic bag with the old dressing. Tie the bag shut and throw it away. °· Keep the dressing clean and dry until your next dressing change. °· Wash your hands. °SEEK MEDICAL CARE IF: °· Your skin around the wound looks red. °· Your wound feels more tender or sore. °· You see pus in the wound. °· Your wound smells bad. °· You have a fever. °· Your skin around the wound has a rash that itches and burns. °· You see black or yellow skin in your wound that was not there before. °· You feel nauseous, throw up, and feel very tired. °Document Released: 04/04/2004 Document Revised: 05/20/2011 Document Reviewed: 01/07/2011 °Exit[employer]® Patient Information ©2014 [employer]. ° °

## 2013-06-08 NOTE — Progress Notes (Signed)
Patient discharge teaching given, including activity, diet, follow-up appoints, dressing change instructions, and medications. Patient verbalized understanding of all discharge instructions. IV access was d/c'd. Vitals are stable. Skin is intact except as charted in most recent assessments. Pt to be escorted out by RN, to be driven home by family.  Jillyn Ledger, MBA, BS, RN

## 2013-06-08 NOTE — Discharge Summary (Signed)
Physician Discharge Summary  Roberto Fisher VVO:[id number] DOB: 07/08/1970 DOA: 06/03/2013  PCP: Default, Provider, MD  Admit date: 06/03/2013 Discharge date: 06/08/2013  Time spent: 9* minutes  Recommendations for Outpatient Follow-up:  Follow up Surgery in 2 weeks  Discharge Diagnoses:  Principal Problem:   Sepsis Active Problems:   Cellulitis of left abdominal wall   Discharge Condition: Stable  Diet recommendation: Regular diet  Filed Weights   06/03/13 1902 06/04/13 0106  Weight: 71.668 kg (158 lb) 72.485 kg (159 lb 12.8 oz)    History of present illness:  43 y.o. male with no significant past medical history started developing a small pimple-like lesion in the left lower quadrant 3 days ago. This soon becoming bigger and painful. Patient also was having fever and chills. In the ER patient was found to have significant leukocytosis and CT abdomen and pelvis was done which shows extensive inflammation of the left lower quadrant with possible phlegmon. ER physician had tried to I&D but there was no abscess draining. Patient has been admitted for IV antibiotics. Patient otherwise denies any chest pain shortness of breath nausea vomiting diarrhea. Patient does not recall having any trauma or insect bite to the area. Has not had any previous episodes similar. Patient states his last tetanus immunization was 5 years ago.      Hospital Course:  Cellulitis/abscess- Patient was started  on vancomycin and Zosyn, surgery was consulted as there was a concern for possible abscess. Patient underwent incision and drainage per surgery, and the wound culture is growing staph aureus, final culture is pending. We'll discharge the patient home on Bactrim DS 1 tablet by mouth twice a day along with dressing change twice a day.  Procedures:  Incision and drainage  Consultations:  Surgery  Discharge Exam: Filed Vitals:   06/08/13 1000  BP: 124/89  Pulse: 88  Temp: 98.4 F (36.9 C)   Resp: 18    General: Appear in no acute distress Cardiovascular: *S1s2 RRR Respiratory: *Clear bilaterally  Discharge Instructions  Discharge Orders   Future Orders Complete By Expires   Change dressing (specify)  As directed    Comments:     Dressing change:2* times per day   Diet - low sodium heart healthy  As directed    Increase activity slowly  As directed        Medication List         acetaminophen 500 MG tablet  Commonly known as:  TYLENOL  Take 1,000 mg by mouth every 6 (six) hours as needed. For pain     oxyCODONE-acetaminophen 5-325 MG per tablet  Commonly known as:  PERCOCET/ROXICET  Take 1 tablet by mouth every 4 (four) hours as needed for moderate pain.     sulfamethoxazole-trimethoprim 800-160 MG per tablet  Commonly known as:  BACTRIM DS  Take 1 tablet by mouth 2 (two) times daily.       No Known Allergies     Follow-up Information   Follow up with TSUEI,MATTHEW K., MD. Schedule an appointment as soon as possible for a visit in 2 weeks.   Specialty:  General Surgery   Contact information:   [address] [place] [address] [phone]        The results of significant diagnostics from this hospitalization (including imaging, microbiology, ancillary and laboratory) are listed below for reference.    Significant Diagnostic Studies: Dg Chest 2 View  06/03/2013   CLINICAL DATA:  Fever.  EXAM: CHEST  2 VIEW  COMPARISON:  None.  FINDINGS: The heart size and mediastinal contours are within normal limits. Both lungs are clear. The visualized skeletal structures are unremarkable.  IMPRESSION: No active cardiopulmonary disease.   Electronically Signed   By: Marcello Moores  Register   On: 06/03/2013 21:56   Ct Abdomen Pelvis W Contrast  06/03/2013   CLINICAL DATA:  Left lower quadrant abdominal pain.  EXAM: CT ABDOMEN AND PELVIS WITH CONTRAST  TECHNIQUE: Multidetector CT imaging of the abdomen and pelvis was performed using the standard  protocol following bolus administration of intravenous contrast.  CONTRAST:  76mL OMNIPAQUE IOHEXOL 300 MG/ML  SOLN  COMPARISON:  CT of the abdomen and pelvis 12/25/2011.  FINDINGS: Lung Bases: Unremarkable.  Abdomen/Pelvis: Multiple low-attenuation liver lesions, similar to the prior examination. Largest of these are compatible with simple cysts, with the largest lesion in segment 2 measuring up to 15 mm in diameter. The appearance of the gallbladder, pancreas, spleen, bilateral adrenal glands and right kidney is unremarkable. There are 2 intermediate attenuation lesions in the left kidney, which appear similar to the prior study, likely to represent proteinaceous cysts, largest of which measures 19 mm in the interpolar region. Probable 2 mm nonobstructive calculus in the interpolar collecting system of the left kidney, similar to the prior study.  No significant volume of ascites. No pneumoperitoneum. No pathologic distention of small bowel. No lymphadenopathy identified within the abdomen or pelvis. Prostate gland and urinary bladder are unremarkable in appearance.  Musculoskeletal: There is a large amount of soft tissue stranding throughout the subcutaneous fat of the lower left anterior abdominal wall extending into the left flank. At this time, no definite well-defined rim enhancing fluid collection is identified to suggest presence of an abscess. There are no aggressive appearing lytic or blastic lesions noted in the visualized portions of the skeleton.  IMPRESSION: 1. Extensive inflammation in the subcutaneous fat of the lower left anterior abdominal wall and left flank. Findings appear compatible with cellulitis and possible phlegmon, but no definite abscess is identified at this time. 2. No acute intra-abdominal process. 3. Additional incidental findings, as above, similar prior studies.   Electronically Signed   By: Vinnie Langton M.D.   On: 06/03/2013 23:17    Microbiology: Recent Results (from the  past 240 hour(s))  CULTURE, BLOOD (ROUTINE X 2)     Status: None   Collection Time    06/04/13 10:46 AM      Result Value Ref Range Status   Specimen Description BLOOD LEFT ANTECUBITAL   Final   Special Requests BOTTLES DRAWN AEROBIC ONLY 2.5CC   Final   Culture  Setup Time     Final   Value: 06/04/2013 14:15     Performed at [facility]   Culture     Final   Value:        BLOOD CULTURE RECEIVED NO GROWTH TO DATE CULTURE WILL BE HELD FOR 5 DAYS BEFORE ISSUING A FINAL NEGATIVE REPORT     Performed at [facility]   Report Status PENDING   Incomplete  CULTURE, BLOOD (ROUTINE X 2)     Status: None   Collection Time    06/04/13 10:58 AM      Result Value Ref Range Status   Specimen Description BLOOD LEFT ARM   Final   Special Requests BOTTLES DRAWN AEROBIC ONLY 3CC   Final   Culture  Setup Time     Final   Value: 06/04/2013 14:15  Performed at [facility]     Final   Value:        BLOOD CULTURE RECEIVED NO GROWTH TO DATE CULTURE WILL BE HELD FOR 5 DAYS BEFORE ISSUING A FINAL NEGATIVE REPORT     Performed at [facility]   Report Status PENDING   Incomplete  SURGICAL PCR SCREEN     Status: None   Collection Time    06/06/13  9:47 AM      Result Value Ref Range Status   MRSA, PCR NEGATIVE  NEGATIVE Final   Staphylococcus aureus NEGATIVE  NEGATIVE Final   Comment:            The Xpert SA Assay (FDA     approved for NASAL specimens     in patients over 7 years of age),     is one component of     a comprehensive surveillance     program.  Test performance has     been validated by [employer] for patients greater     than or equal to 41 year old.     It is not intended     to diagnose infection nor to     guide or monitor treatment.  CULTURE, ROUTINE-ABSCESS     Status: None   Collection Time    06/06/13  1:05 PM      Result Value Ref Range Status   Specimen Description ABSCESS ABDOMEN   Final   Special Requests PATIENT ON  FOLLOWING ZOSYN VANCOMYCIN   Final   Gram Stain     Final   Value: ABUNDANT WBC PRESENT, PREDOMINANTLY PMN     NO SQUAMOUS EPITHELIAL CELLS SEEN     MODERATE GRAM POSITIVE COCCI IN PAIRS     IN CLUSTERS     Performed at [facility]   Culture     Final   Value: ABUNDANT STAPHYLOCOCCUS AUREUS     Note: RIFAMPIN AND GENTAMICIN SHOULD NOT BE USED AS SINGLE DRUGS FOR TREATMENT OF STAPH INFECTIONS.     Performed at [facility]   Report Status PENDING   Incomplete  ANAEROBIC CULTURE     Status: None   Collection Time    06/06/13  1:05 PM      Result Value Ref Range Status   Specimen Description ABSCESS ABDOMEN   Final   Special Requests PATIENT ON FOLLOWING ZOSYN VANCOMYCIN   Final   Gram Stain     Final   Value: ABUNDANT WBC PRESENT, PREDOMINANTLY PMN     RARE SQUAMOUS EPITHELIAL CELLS PRESENT     MODERATE GRAM POSITIVE COCCI IN PAIRS     IN CLUSTERS     Performed at [facility]   Culture     Final   Value: NO ANAEROBES ISOLATED; CULTURE IN PROGRESS FOR 5 DAYS     Performed at [facility]   Report Status PENDING   Incomplete  TISSUE CULTURE     Status: None   Collection Time    06/06/13  1:16 PM      Result Value Ref Range Status   Specimen Description TISSUE ABDOMEN   Final   Special Requests TISSUE FROM ABDOMEN WALL PT ON ZOSYN VANCOMYCIN   Final   Gram Stain     Final   Value: FEW WBC PRESENT, PREDOMINANTLY PMN     FEW SQUAMOUS EPITHELIAL CELLS PRESENT     FEW Lonell Grandchild  POSITIVE COCCI IN PAIRS     IN CLUSTERS     Performed at [facility]   Culture     Final   Value: MODERATE STAPHYLOCOCCUS AUREUS     Note: RIFAMPIN AND GENTAMICIN SHOULD NOT BE USED AS SINGLE DRUGS FOR TREATMENT OF STAPH INFECTIONS.     Performed at [facility]   Report Status PENDING   Incomplete     Labs: Basic Metabolic Panel:  Recent Labs Lab 06/03/13 2014 06/04/13 0630 06/08/13 0635  NA 140 138 140  K 4.8 4.3 3.8  CL 97 100 102  CO2 27 25 26    GLUCOSE 112* 104* 81  BUN 17 13 9   CREATININE 1.07 1.06 0.89  CALCIUM 9.4 8.0* 8.9   Liver Function Tests:  Recent Labs Lab 06/04/13 0630  AST 13  ALT 9  ALKPHOS 66  BILITOT 1.2  PROT 6.4  ALBUMIN 2.7*   No results found for this basename: LIPASE, AMYLASE,  in the last 168 hours No results found for this basename: AMMONIA,  in the last 168 hours CBC:  Recent Labs Lab 06/03/13 2014 06/04/13 0630 06/04/13 1646 06/05/13 0549 06/08/13 0635  WBC 21.8* 18.1* 20.0* 18.2* 10.6*  NEUTROABS 19.3* 15.7*  --   --   --   HGB 14.9 12.7* 12.3* 11.6* 11.9*  HCT 43.3 37.4* 36.2* 34.5* 35.8*  MCV 83.9 83.7 84.2 84.4 85.6  PLT 228 190 210 196 324   Cardiac Enzymes: No results found for this basename: CKTOTAL, CKMB, CKMBINDEX, TROPONINI,  in the last 168 hours BNP: BNP (last 3 results) No results found for this basename: PROBNP,  in the last 8760 hours CBG: No results found for this basename: GLUCAP,  in the last 168 hours     Signed:  Nakayla Rorabaugh S  [facility] 06/08/2013, 11:48 AM

## 2013-06-08 NOTE — Progress Notes (Signed)
Seen patient walking down the hallways twice.

## 2013-06-08 NOTE — Progress Notes (Signed)
   CARE MANAGEMENT NOTE 06/08/2013  Patient:  Roberto Fisher, Roberto Fisher   Account Number:  [id number]  Date Initiated:  06/08/2013  Documentation initiated by:  Lizabeth Leyden  Subjective/Objective Assessment:   admitted with cellulitis left flank area  I&D done 06/06/2013     Action/Plan:   [facility] RN for wound care   Anticipated DC Date:  06/08/2013   Anticipated DC Plan:  [place]  CM consult      [facility] Choice  HOME HEALTH   Choice offered to / List presented to:  C-1 Patient        [place] arranged  HH-1 RN      [place].   Status of service:  Completed, signed off Medicare Important Message given?   (If response is "NO", the following Medicare IM given date fields will be blank) Date Medicare IM given:   Date Additional Medicare IM given:    Discharge Disposition:  [place]  Per UR Regulation:    If discussed at [place] Length of Stay Meetings, dates discussed:   06/08/2013    Comments:  06/08/2013  [place], [place] CM referral: home health RN  Met with patient regarding home health services, for an RN to teach him about dressing changes. He is not working, has no insurance and living at his parent's house. His mom will be at bedside today at 3:00 pm to watch the dressing changed and help him at home.  Discussed [facility] will meet with him about home care services and set up a financial arrangement.  [facility]/Donna called with referral and provided patient's living and financial situation. She noted since the patient resides with his parents they will look at the household income to determine if eligible for financial assistance.  Spoke with patient regarding the home health agency's guidelines for visits. He will talk with his mom, she told him she can do this.  Met with patient and mom regarding wound care. She has done it for her husband with his  back surgery and feel she can do this.  RN will teach wound care to patient and mom prior to discharge

## 2013-06-09 LAB — TISSUE CULTURE

## 2013-06-09 LAB — CULTURE, ROUTINE-ABSCESS

## 2013-06-10 LAB — CULTURE, BLOOD (ROUTINE X 2)
Culture: NO GROWTH
Culture: NO GROWTH

## 2013-06-11 LAB — ANAEROBIC CULTURE

## 2013-06-22 ENCOUNTER — Ambulatory Visit (INDEPENDENT_AMBULATORY_CARE_PROVIDER_SITE_OTHER): Payer: Self-pay | Admitting: Surgery

## 2013-06-22 ENCOUNTER — Encounter (INDEPENDENT_AMBULATORY_CARE_PROVIDER_SITE_OTHER): Payer: Self-pay | Admitting: Surgery

## 2013-06-22 VITALS — BP 110/80 | HR 80 | Temp 97.4°F | Resp 12 | Ht 66.0 in | Wt 158.8 lb

## 2013-06-22 DIAGNOSIS — Z09 Encounter for follow-up examination after completed treatment for conditions other than malignant neoplasm: Secondary | ICD-10-CM

## 2013-06-22 NOTE — Progress Notes (Signed)
Subjective:     Patient ID: Roberto Fisher, male   DOB: Jun 27, 1970, 43 y.o.   MRN: [id number]  HPI This gentleman is here for his first postoperative visit status post incision and drainage of a left abdominal wall abscess by Dr. Georgette Dover.  He is on antibiotics and has been undergoing wet-to-dry dressing changes twice daily. He is doing well has no complaints  Review of Systems     Objective:   Physical Exam On exam, the wound is healing well with excellent granulation tissue. The wound is superficial     Assessment:     Patient stable postop     Plan:     He will back to the wound care down to once daily wet to dry dressing changes and wash the wound with soap and water in the shower daily. He will come back to our office in 2 weeks for recheck

## 2013-07-05 ENCOUNTER — Encounter (INDEPENDENT_AMBULATORY_CARE_PROVIDER_SITE_OTHER): Payer: Self-pay | Admitting: Surgery

## 2013-07-09 ENCOUNTER — Encounter (INDEPENDENT_AMBULATORY_CARE_PROVIDER_SITE_OTHER): Payer: Self-pay | Admitting: Surgery

## 2013-07-09 ENCOUNTER — Ambulatory Visit (INDEPENDENT_AMBULATORY_CARE_PROVIDER_SITE_OTHER): Payer: Self-pay | Admitting: Surgery

## 2013-07-09 VITALS — BP 110/80 | HR 70 | Temp 97.6°F | Resp 12 | Ht 66.0 in | Wt 162.8 lb

## 2013-07-09 DIAGNOSIS — L02211 Cutaneous abscess of abdominal wall: Secondary | ICD-10-CM | POA: Insufficient documentation

## 2013-07-09 DIAGNOSIS — L02219 Cutaneous abscess of trunk, unspecified: Secondary | ICD-10-CM

## 2013-07-09 DIAGNOSIS — L03311 Cellulitis of abdominal wall: Secondary | ICD-10-CM

## 2013-07-09 DIAGNOSIS — L03319 Cellulitis of trunk, unspecified: Secondary | ICD-10-CM

## 2013-07-09 HISTORY — DX: Cutaneous abscess of abdominal wall: L02.211

## 2013-07-09 NOTE — Progress Notes (Signed)
Status post incision and drainage of a large left abdominal wall abscess on 06/06/13. The wound is healed. No tenderness. No sign of saline this. The patient may resume full activity. Followup when necessary.  Imogene Burn. Georgette Dover, MD, [facility] Surgery  General/ Trauma Surgery  07/09/2013 9:55 AM

## 2014-08-03 IMAGING — CT CT ABD-PELV W/ CM
2 of 5 series · 16 of 46 positions shown, 18 images · IV contrast (APPLIED)
Comparison: CT of the abdomen and pelvis 12/25/2011.

CLINICAL DATA: Left lower quadrant abdominal pain.

EXAM:
CT ABDOMEN AND PELVIS WITH CONTRAST
TECHNIQUE: Multidetector CT imaging of the abdomen and pelvis was performed
using the standard protocol following bolus administration of
intravenous contrast.
CONTRAST:  80mL OMNIPAQUE IOHEXOL 300 MG/ML  SOLN

[Series 2: abd/ pelvis 5.0 i30f 1 · axial · 0.88mm/px · z∈[+858,+1218]mm · 13 of 82 slices shown, 15 images]
[im 5/82  soft-tissue]
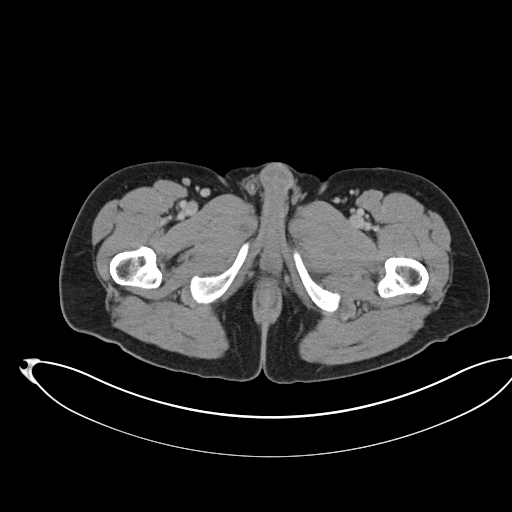
[im 5/82  bone]
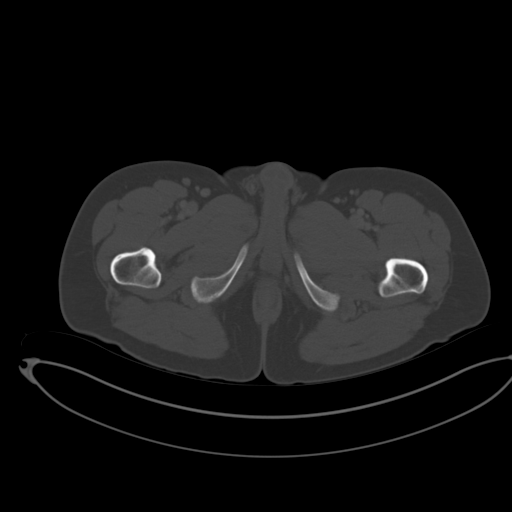
[im 10/82  soft-tissue]
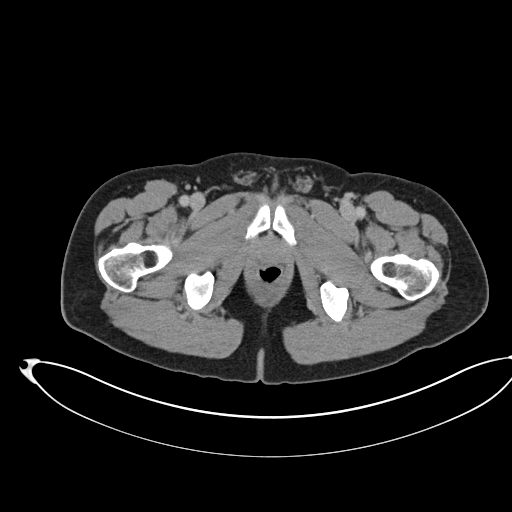
[im 19/82  soft-tissue]
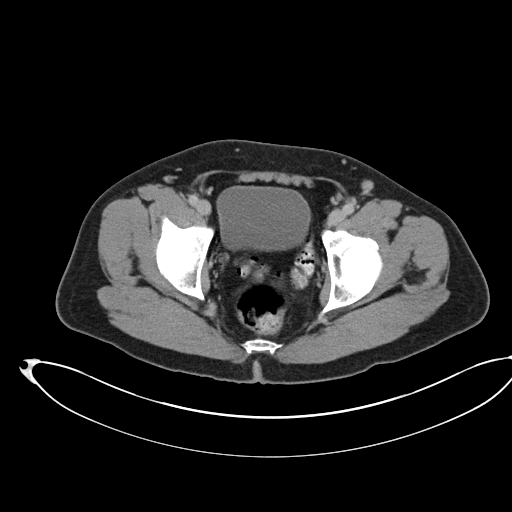
[im 23/82  soft-tissue]
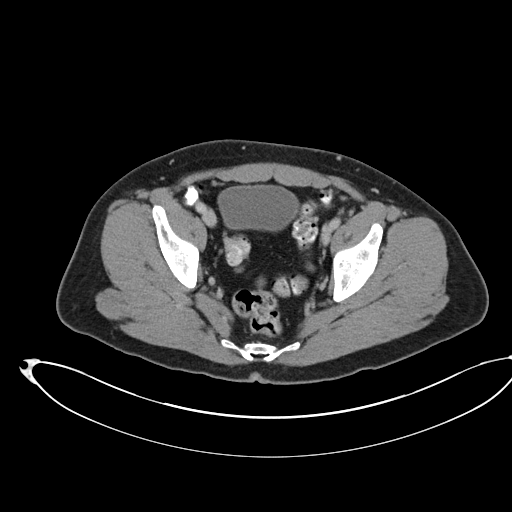
[im 28/82  soft-tissue]
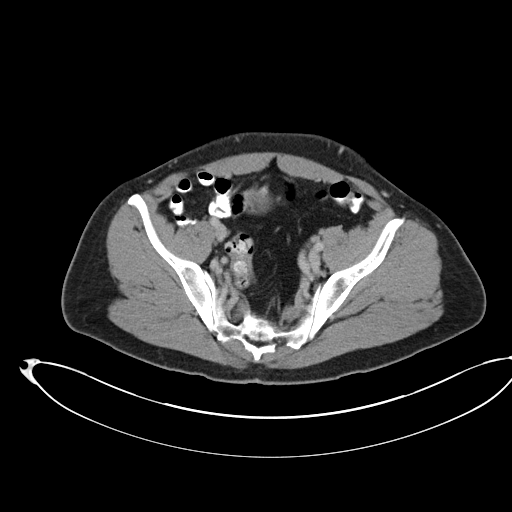
[im 37/82  soft-tissue]
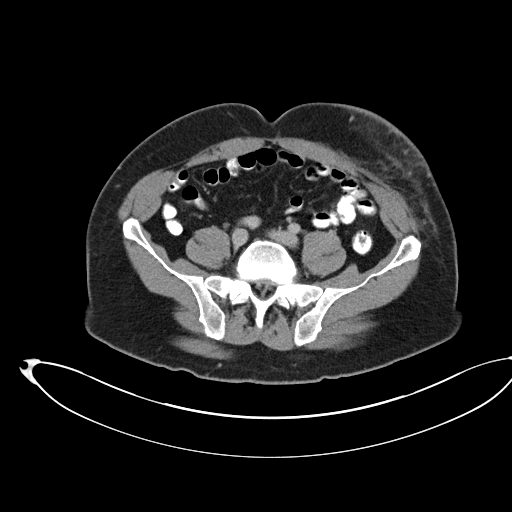
[im 41/82  soft-tissue]
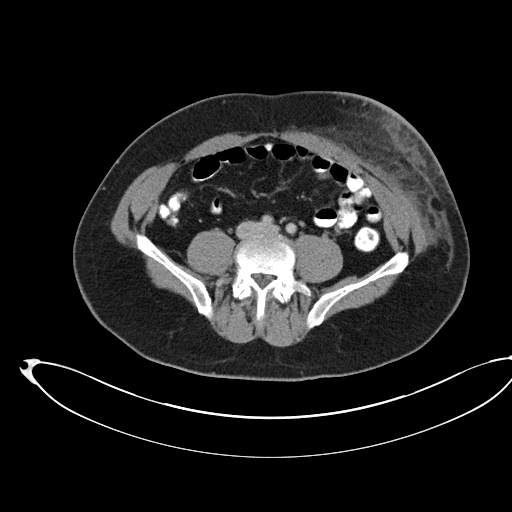
[im 46/82  soft-tissue]
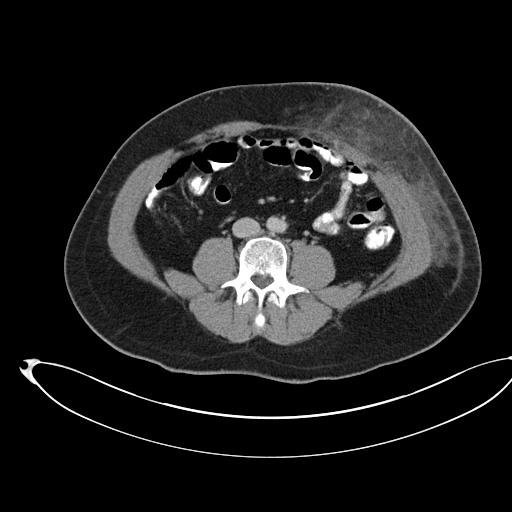
[im 55/82  soft-tissue]
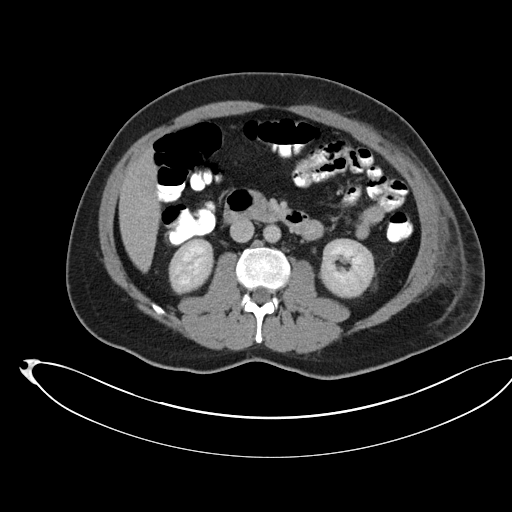
[im 55/82  bone]
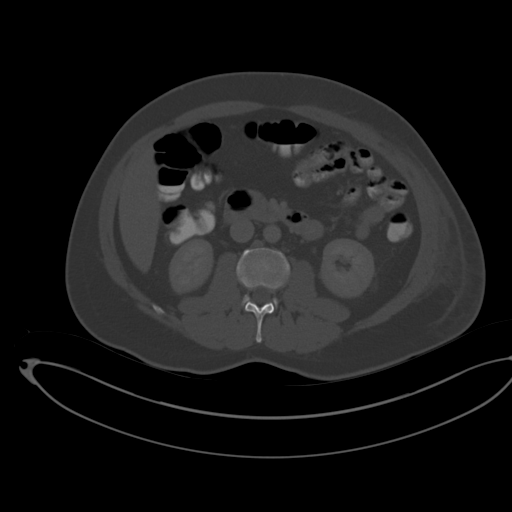
[im 59/82  soft-tissue]
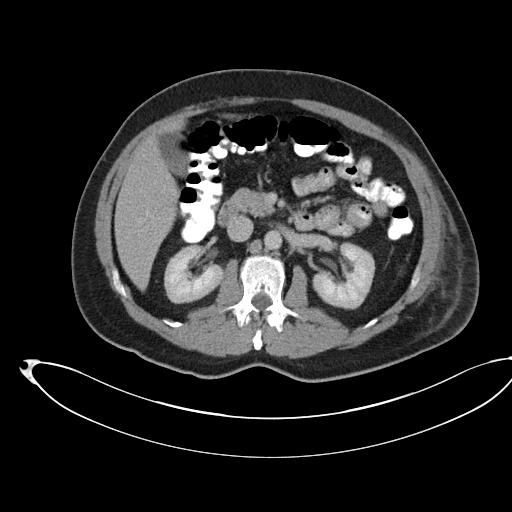
[im 64/82  soft-tissue]
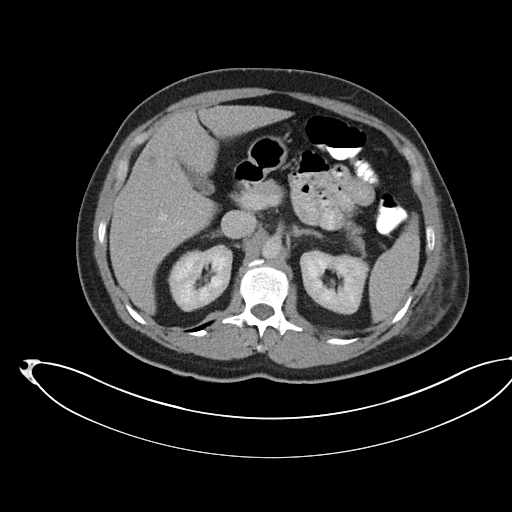
[im 73/82  soft-tissue]
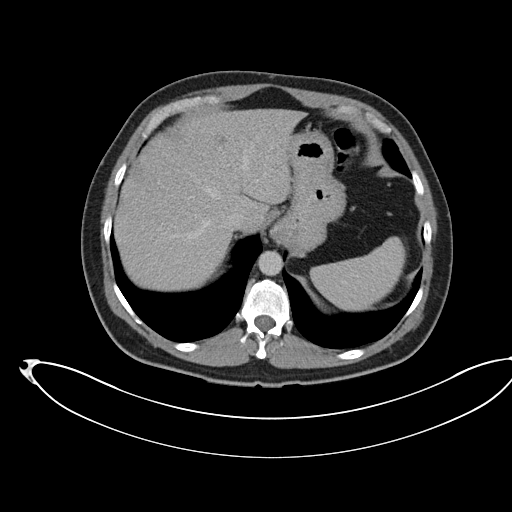
[im 77/82  soft-tissue]
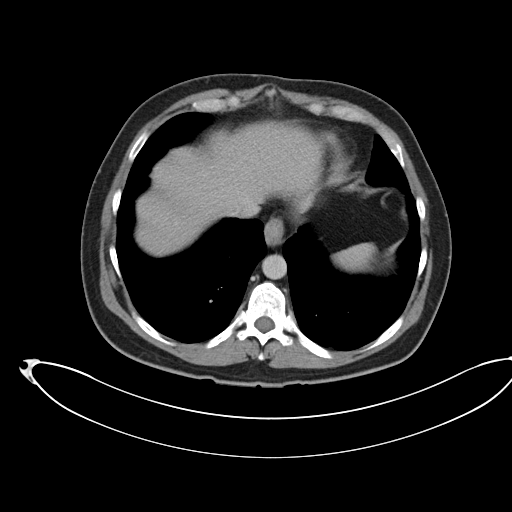

[Series 5: coronals · coronal · 0.75mm/px · 3 of 151 slices shown]
[im 51/151  soft-tissue]
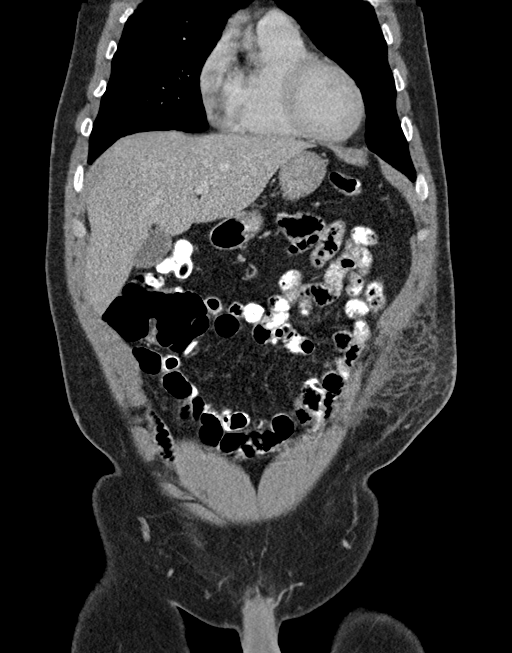
[im 67/151  soft-tissue]
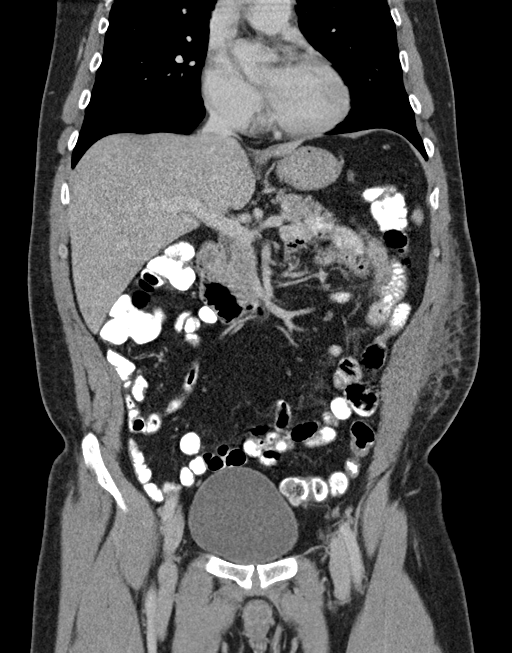
[im 84/151  soft-tissue]
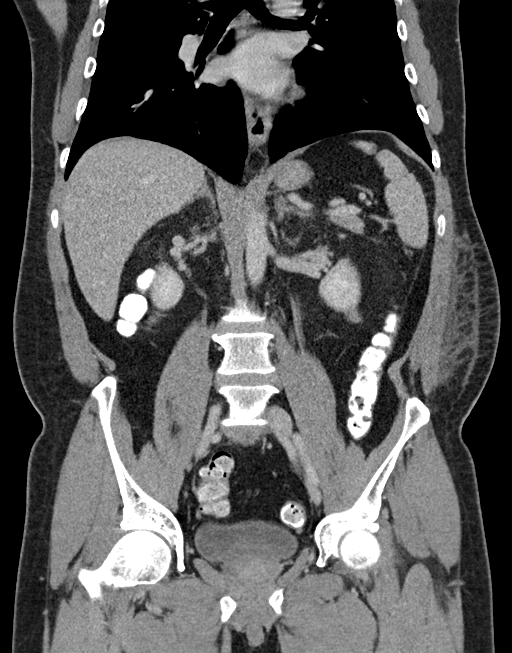

[16 of 46 positions shown; findings below may reference images not displayed]

FINDINGS: Lung Bases: Unremarkable.

Abdomen/Pelvis: Multiple low-attenuation liver lesions, similar to
the prior examination. Largest of these are compatible with simple
cysts, with the largest lesion in segment 2 measuring up to 15 mm in
diameter. The appearance of the gallbladder, pancreas, spleen,
bilateral adrenal glands and right kidney is unremarkable. There are
2 intermediate attenuation lesions in the left kidney, which appear
similar to the prior study, likely to represent proteinaceous cysts,
largest of which measures 19 mm in the interpolar region. Probable 2
mm nonobstructive calculus in the interpolar collecting system of
the left kidney, similar to the prior study.

No significant volume of ascites. No pneumoperitoneum. No pathologic
distention of small bowel. No lymphadenopathy identified within the
abdomen or pelvis. Prostate gland and urinary bladder are
unremarkable in appearance.

Musculoskeletal: There is a large amount of soft tissue stranding
throughout the subcutaneous fat of the lower left anterior abdominal
wall extending into the left flank. At this time, no definite
well-defined rim enhancing fluid collection is identified to suggest
presence of an abscess. There are no aggressive appearing lytic or
blastic lesions noted in the visualized portions of the skeleton.
IMPRESSION: 1. Extensive inflammation in the subcutaneous fat of the lower left
anterior abdominal wall and left flank. Findings appear compatible
with cellulitis and possible phlegmon, but no definite abscess is
identified at this time.
2. No acute intra-abdominal process.
3. Additional incidental findings, as above, similar prior studies.

## 2014-10-12 ENCOUNTER — Encounter (HOSPITAL_COMMUNITY): Payer: Self-pay | Admitting: Family Medicine

## 2014-10-12 ENCOUNTER — Emergency Department (HOSPITAL_COMMUNITY)
Admission: EM | Admit: 2014-10-12 | Discharge: 2014-10-12 | Disposition: A | Discharge status: Home / Self Care | Payer: Self-pay | Attending: Emergency Medicine | Admitting: Emergency Medicine

## 2014-10-12 ENCOUNTER — Emergency Department (HOSPITAL_COMMUNITY): Payer: Self-pay

## 2014-10-12 DIAGNOSIS — R079 Chest pain, unspecified: Secondary | ICD-10-CM | POA: Insufficient documentation

## 2014-10-12 LAB — CBC
HCT: 42.6 % (ref 39.0–52.0)
Hemoglobin: 13.7 g/dL (ref 13.0–17.0)
MCH: 27.3 pg (ref 26.0–34.0)
MCHC: 32.2 g/dL (ref 30.0–36.0)
MCV: 84.9 fL (ref 78.0–100.0)
Platelets: 235 10*3/uL (ref 150–400)
RBC: 5.02 MIL/uL (ref 4.22–5.81)
RDW: 12.4 % (ref 11.5–15.5)
WBC: 8.7 10*3/uL (ref 4.0–10.5)

## 2014-10-12 LAB — BASIC METABOLIC PANEL
ANION GAP: 8 (ref 5–15)
BUN: 11 mg/dL (ref 6–20)
CO2: 24 mmol/L (ref 22–32)
CREATININE: 0.98 mg/dL (ref 0.61–1.24)
Calcium: 9.1 mg/dL (ref 8.9–10.3)
Chloride: 107 mmol/L (ref 101–111)
GFR calc Af Amer: >60 mL/min (ref >60)
Glucose, Bld: 90 mg/dL (ref 65–99)
Potassium: 3.8 mmol/L (ref 3.5–5.1)
Sodium: 139 mmol/L (ref 135–145)

## 2014-10-12 LAB — I-STAT TROPONIN, ED: Troponin i, poc: 0 ng/mL (ref 0.00–0.08)

## 2014-10-12 MED ORDER — LORAZEPAM 1 MG PO TABS: 1.0000 mg | ORAL_TABLET | Freq: Three times a day (TID) | ORAL | Status: DC | PRN

## 2014-10-12 MED ORDER — LORAZEPAM 1 MG PO TABS
1.0000 mg | ORAL_TABLET | Freq: Once | ORAL | Status: AC
  Administered 2014-10-12: 1 mg via ORAL
  Filled 2014-10-12: qty 1

## 2014-10-12 NOTE — Discharge Instructions (Signed)
Chest Pain (Nonspecific) °It is often hard to give a specific diagnosis for the cause of chest pain. There is always a chance that your pain could be related to something serious, such as a heart attack or a blood clot in the lungs. You need to follow up with your health care provider for further evaluation. °CAUSES  °· Heartburn. °· Pneumonia or bronchitis. °· Anxiety or stress. °· Inflammation around your heart (pericarditis) or lung (pleuritis or pleurisy). °· A blood clot in the lung. °· A collapsed lung (pneumothorax). It can develop suddenly on its own (spontaneous pneumothorax) or from trauma to the chest. °· Shingles infection (herpes zoster virus). °The chest wall is composed of bones, muscles, and cartilage. Any of these can be the source of the pain. °· The bones can be bruised by injury. °· The muscles or cartilage can be strained by coughing or overwork. °· The cartilage can be affected by inflammation and become sore (costochondritis). °DIAGNOSIS  °Lab tests or other studies may be needed to find the cause of your pain. Your health care provider may have you take a test called an ambulatory electrocardiogram (ECG). An ECG records your heartbeat patterns over a 24-hour period. You may also have other tests, such as: °· Transthoracic echocardiogram (TTE). During echocardiography, sound waves are used to evaluate how blood flows through your heart. °· Transesophageal echocardiogram (TEE). °· Cardiac monitoring. This allows your health care provider to monitor your heart rate and rhythm in real time. °· Holter monitor. This is a portable device that records your heartbeat and can help diagnose heart arrhythmias. It allows your health care provider to track your heart activity for several days, if needed. °· Stress tests by exercise or by giving medicine that makes the heart beat faster. °TREATMENT  °· Treatment depends on what may be causing your chest pain. Treatment may include: °¨ Acid blockers for  heartburn. °¨ Anti-inflammatory medicine. °¨ Pain medicine for inflammatory conditions. °¨ Antibiotics if an infection is present. °· You may be advised to change lifestyle habits. This includes stopping smoking and avoiding alcohol, caffeine, and chocolate. °· You may be advised to keep your head raised (elevated) when sleeping. This reduces the chance of acid going backward from your stomach into your esophagus. °Most of the time, nonspecific chest pain will improve within 2-3 days with rest and mild pain medicine.  °HOME CARE INSTRUCTIONS  °· If antibiotics were prescribed, take them as directed. Finish them even if you start to feel better. °· For the next few days, avoid physical activities that bring on chest pain. Continue physical activities as directed. °· Do not use any tobacco products, including cigarettes, chewing tobacco, or electronic cigarettes. °· Avoid drinking alcohol. °· Only take medicine as directed by your health care provider. °· Follow your health care provider's suggestions for further testing if your chest pain does not go away. °· Keep any follow-up appointments you made. If you do not go to an appointment, you could develop lasting (chronic) problems with pain. If there is any problem keeping an appointment, call to reschedule. °SEEK MEDICAL CARE IF:  °· Your chest pain does not go away, even after treatment. °· You have a rash with blisters on your chest. °· You have a fever. °SEEK IMMEDIATE MEDICAL CARE IF:  °· You have increased chest pain or pain that spreads to your arm, neck, jaw, back, or abdomen. °· You have shortness of breath. °· You have an increasing cough, or you cough   up blood.  You have severe back or abdominal pain.  You feel nauseous or vomit.  You have severe weakness.  You faint.  You have chills. This is an emergency. Do not wait to see if the pain will go away. Get medical help at once. Call your local emergency services (911 in [place] Do not drive  yourself to the hospital. MAKE SURE YOU:   Understand these instructions.  Will watch your condition.  Will get help right away if you are not doing well or get worse. Document Released: 12/05/2004 Document Revised: 03/02/2013 Document Reviewed: 10/01/2007 [facility] Patient Information 2015 [place], [place]. This information is not intended to replace advice given to you by your health care provider. Make sure you discuss any questions you have with your health care provider. Generalized Anxiety Disorder Generalized anxiety disorder (GAD) is a mental disorder. It interferes with life functions, including relationships, work, and school. GAD is different from normal anxiety, which everyone experiences at some point in their lives in response to specific life events and activities. Normal anxiety actually helps [place] prepare for and get through these life events and activities. Normal anxiety goes away after the event or activity is over.  GAD causes anxiety that is not necessarily related to specific events or activities. It also causes excess anxiety in proportion to specific events or activities. The anxiety associated with GAD is also difficult to control. GAD can vary from mild to severe. People with severe GAD can have intense waves of anxiety with physical symptoms (panic attacks).  SYMPTOMS The anxiety and worry associated with GAD are difficult to control. This anxiety and worry are related to many life events and activities and also occur more days than not for 6 months or longer. People with GAD also have three or more of the following symptoms (one or more in children):  Restlessness.   Fatigue.  Difficulty concentrating.   Irritability.  Muscle tension.  Difficulty sleeping or unsatisfying sleep. DIAGNOSIS GAD is diagnosed through an assessment by your health care provider. Your health care provider will ask you questions aboutyour mood,physical symptoms, and events in your  life. Your health care provider may ask you about your medical history and use of alcohol or drugs, including prescription medicines. Your health care provider may also do a physical exam and blood tests. Certain medical conditions and the use of certain substances can cause symptoms similar to those associated with GAD. Your health care provider may refer you to a mental health specialist for further evaluation. TREATMENT The following therapies are usually used to treat GAD:   Medication. Antidepressant medication usually is prescribed for long-term daily control. Antianxiety medicines may be added in severe cases, especially when panic attacks occur.   Talk therapy (psychotherapy). Certain types of talk therapy can be helpful in treating GAD by providing support, education, and guidance. A form of talk therapy called cognitive behavioral therapy can teach you healthy ways to think about and react to daily life events and activities.  Stress managementtechniques. These include yoga, meditation, and exercise and can be very helpful when they are practiced regularly. A mental health specialist can help determine which treatment is best for you. Some people see improvement with one therapy. However, other people require a combination of therapies. Document Released: 06/22/2012 Document Revised: 07/12/2013 Document Reviewed: 06/22/2012 [facility] Patient Information 2015 [place], [place]. This information is not intended to replace advice given to you by your health care provider. Make sure you discuss any questions you  have with your health care provider. ° °

## 2014-10-12 NOTE — ED Notes (Signed)
Pt here with sharp pain and pressure in chest that started last night. sts also diarrhea for the last 2 weeks. Denise SOB. Denies cough, fever.

## 2014-10-12 NOTE — Progress Notes (Signed)
Roberto Fisher Specialist [facility] [phone]  Spoke to patient regarding primary care resources and the [facility] orange card. Orange card application provided and explained to patient and family at bedside. Patient was instructed to contact me once discharge for an appointment to obtain the orange card and to establish care with a provider, pt verbalized understanding. Resource guide and my contact information also provided for any future questions or concerns. No other [place] Specialist needs identified at this time.

## 2014-10-12 NOTE — ED Provider Notes (Signed)
CSN: [id number]     Arrival date & time 10/12/14  1143 History   First MD Initiated Contact with Patient 10/12/14 1255     Chief Complaint  Patient presents with  . Chest Pain     (Consider location/radiation/quality/duration/timing/severity/associated sxs/prior Treatment) HPI Comments: Patient with no pertinent past medical history, presents to the emergency department with chief complaint of chest pain. Patient states pain started yesterday around 6 PM. States that it has been constant until now. It has not waxed or waned. Patient denies any exertional symptoms. Denies any shortness breath. Denies any radiating symptoms. He states that he has had this pain intermittently for the past year. He denies any heart problems. He states that he has felt anxious because he does not have a job, and would like to get a job. He has not taken anything to alleviate his symptoms.  The history is provided by the patient. No language interpreter was used.    Past Medical History  Diagnosis Date  . Medical history non-contributory    Past Surgical History  Procedure Laterality Date  . No past surgeries    . Incision and drainage abscess N/A 06/06/2013    Procedure: INCISION AND DRAINAGE DEBRIDEMENT OF ABDOMINAL WALL;  Surgeon: Imogene Burn. Georgette Dover, MD;  Location: [facility];  Service: General;  Laterality: N/A;   Family History  Problem Relation Age of Onset  . Hypertension Mother   . Diabetes Mellitus II Father    History  Substance Use Topics  . Smoking status: Never Smoker   . Smokeless tobacco: Not on file  . Alcohol Use: No    Review of Systems  Constitutional: Negative for fever and chills.  Respiratory: Negative for shortness of breath.   Cardiovascular: Positive for chest pain.  Gastrointestinal: Negative for nausea, vomiting, diarrhea and constipation.  Genitourinary: Negative for dysuria.      Allergies  Review of patient's allergies indicates no known allergies.  Home Medications    Prior to Admission medications   Medication Sig Start Date End Date Taking? Authorizing Provider  OVER THE COUNTER MEDICATION Take 1 tablet by mouth at bedtime as needed (for sleep). Medication : over the counter "calming vitamin"   Yes Historical Provider, MD   BP 129/93 mmHg  Pulse 74  Temp(Src) 98.2 F (36.8 C) (Oral)  Resp 23  SpO2 99% Physical Exam  Constitutional: He is oriented to person, place, and time. He appears well-developed and well-nourished.  HENT:  Head: Normocephalic and atraumatic.  Eyes: Conjunctivae and EOM are normal. Pupils are equal, round, and reactive to light. Right eye exhibits no discharge. Left eye exhibits no discharge. No scleral icterus.  Neck: Normal range of motion. Neck supple. No JVD present.  Cardiovascular: Normal rate, regular rhythm and normal heart sounds.  Exam reveals no gallop and no friction rub.   No murmur heard. Pulmonary/Chest: Effort normal and breath sounds normal. No respiratory distress. He has no wheezes. He has no rales. He exhibits no tenderness.  Abdominal: Soft. He exhibits no distension and no mass. There is no tenderness. There is no rebound and no guarding.  Musculoskeletal: Normal range of motion. He exhibits no edema or tenderness.  Neurological: He is alert and oriented to person, place, and time.  Skin: Skin is warm and dry.  Psychiatric: He has a normal mood and affect. His behavior is normal. Judgment and thought content normal.  Nursing note and vitals reviewed.   ED Course  Procedures (including critical care time) Results for  orders placed or performed during the hospital encounter of 66/59/93  Basic metabolic panel  Result Value Ref Range   Sodium 139 135 - 145 mmol/L   Potassium 3.8 3.5 - 5.1 mmol/L   Chloride 107 101 - 111 mmol/L   CO2 24 22 - 32 mmol/L   Glucose, Bld 90 65 - 99 mg/dL   BUN 11 6 - 20 mg/dL   Creatinine, Ser 0.98 0.61 - 1.24 mg/dL   Calcium 9.1 8.9 - 10.3 mg/dL   GFR calc non Af Amer  >60 >60 mL/min   GFR calc Af Amer >60 >60 mL/min   Anion gap 8 5 - 15  CBC  Result Value Ref Range   WBC 8.7 4.0 - 10.5 K/uL   RBC 5.02 4.22 - 5.81 MIL/uL   Hemoglobin 13.7 13.0 - 17.0 g/dL   HCT 42.6 39.0 - 52.0 %   MCV 84.9 78.0 - 100.0 fL   MCH 27.3 26.0 - 34.0 pg   MCHC 32.2 30.0 - 36.0 g/dL   RDW 12.4 11.5 - 15.5 %   Platelets 235 150 - 400 K/uL  I-stat troponin, ED  Result Value Ref Range   Troponin i, poc 0.00 0.00 - 0.08 ng/mL   Comment 3           Dg Chest 2 View  10/12/2014   CLINICAL DATA:  Intermittent chest pain for several weeks  EXAM: CHEST  2 VIEW  COMPARISON:  June 03, 2013  FINDINGS: There is no edema or consolidation. The heart size and pulmonary vascularity normal. No adenopathy. No pneumothorax. No bone lesions.  IMPRESSION: No abnormality noted.   Electronically Signed   By: Lowella Grip III M.D.   On: 10/12/2014 13:17     Imaging Review Dg Chest 2 View  10/12/2014   CLINICAL DATA:  Intermittent chest pain for several weeks  EXAM: CHEST  2 VIEW  COMPARISON:  June 03, 2013  FINDINGS: There is no edema or consolidation. The heart size and pulmonary vascularity normal. No adenopathy. No pneumothorax. No bone lesions.  IMPRESSION: No abnormality noted.   Electronically Signed   By: Lowella Grip III M.D.   On: 10/12/2014 13:17     EKG Interpretation None     ED ECG REPORT  I personally interpreted this EKG   Date: 10/12/2014   Rate: 78  Rhythm: normal sinus rhythm  QRS Axis: normal  Intervals: normal  ST/T Wave abnormalities: normal  Conduction Disutrbances:none  Narrative Interpretation:   Old EKG Reviewed: none available   MDM   Final diagnoses:  Chest pain, unspecified chest pain type    Patient with chest pain that started last night at 6:00 PM. Will check labs, x-ray, and EKG. Will reassess.  EKG is normal. Chest x-ray negative. Labs are reassuring. Troponin is negative. Heart score is 1. He is low risk for ACS. PERC negative, doubt  pulmonary embolus. Will give Ativan as the patient has expressed some anxiety. Anticipate discharge to home with outpatient follow-up. Patient may benefit from outpatient cardiology or psychiatry workup.  Patient reassessed and is feeling much better. Will discharge to home.    Montine Circle, PA-C 10/12/14 1543  Elnora Morrison, MD 10/12/14 [phone]

## 2014-12-18 ENCOUNTER — Encounter (HOSPITAL_COMMUNITY): Payer: Self-pay | Admitting: *Deleted

## 2014-12-18 ENCOUNTER — Emergency Department (HOSPITAL_COMMUNITY)
Admission: EM | Admit: 2014-12-18 | Discharge: 2014-12-18 | Disposition: A | Discharge status: Home / Self Care | Payer: Self-pay | Attending: Emergency Medicine | Admitting: Emergency Medicine

## 2014-12-18 DIAGNOSIS — H109 Unspecified conjunctivitis: Secondary | ICD-10-CM | POA: Insufficient documentation

## 2014-12-18 MED ORDER — TOBRAMYCIN-DEXAMETHASONE 0.3-0.1 % OP SUSP
2.0000 [drp] | OPHTHALMIC | Status: DC
  Administered 2014-12-18: 2 [drp] via OPHTHALMIC
  Filled 2014-12-18: qty 2.5

## 2014-12-18 MED ORDER — LORATADINE 10 MG PO TABS: 10.0000 mg | ORAL_TABLET | Freq: Every day | ORAL | Status: DC

## 2014-12-18 NOTE — ED Provider Notes (Signed)
CSN: [id number]     Arrival date & time 12/18/14  0160 History  By signing my name below, I, Arianna Nassar, attest that this documentation has been prepared under the direction and in the presence of Cheyanna Strick, PA-C. Electronically Signed: Julien Nordmann, ED Scribe. 12/18/2014. 10:53 AM.      Chief Complaint  Patient presents with  . Eye Problem      The history is provided by the patient. No language interpreter was used.   HPI Comments: Roberto Fisher is a 44 y.o. male who has a hx of seasonal allergies presents to the Emergency Department complaining of constant, gradual worsening bilateral eye swelling. He has associated bilateral eye redness and pain. He reports waking up this morning and not being able to open his eyes completely due to swelling. He denies wearing contacts, wearing glasses, changes in environmental changes, contact with chemicals, light sensitivity.  Past Medical History  Diagnosis Date  . Medical history non-contributory    Past Surgical History  Procedure Laterality Date  . No past surgeries    . Incision and drainage abscess N/A 06/06/2013    Procedure: INCISION AND DRAINAGE DEBRIDEMENT OF ABDOMINAL WALL;  Surgeon: Imogene Burn. Georgette Dover, MD;  Location: [facility];  Service: General;  Laterality: N/A;   Family History  Problem Relation Age of Onset  . Hypertension Mother   . Diabetes Mellitus II Father    Social History  Substance Use Topics  . Smoking status: Never Smoker   . Smokeless tobacco: Not on file  . Alcohol Use: No    Review of Systems  Eyes: Positive for photophobia, pain and redness. Negative for visual disturbance.  All other systems reviewed and are negative.     Allergies  Review of patient's allergies indicates no known allergies.  Home Medications   Prior to Admission medications   Medication Sig Start Date End Date Taking? Authorizing Provider  LORazepam (ATIVAN) 1 MG tablet Take 1 tablet (1 mg total) by mouth every 8  (eight) hours as needed for anxiety. 10/12/14   Montine Circle, PA-C  OVER THE COUNTER MEDICATION Take 1 tablet by mouth at bedtime as needed (for sleep). Medication : over the counter "calming vitamin"    Historical Provider, MD   Triage vitals: BP 118/86 mmHg  Pulse 88  Temp(Src) 98.9 F (37.2 C) (Oral)  Resp 18  Ht 5\' 6"  (1.676 m)  Wt 145 lb (65.772 kg)  BMI 23.41 kg/m2  SpO2 98% Physical Exam  Constitutional: He appears well-developed and well-nourished. No distress.  HENT:  Head: Normocephalic and atraumatic.  Nasal congestion present  Eyes: EOM are normal. Pupils are equal, round, and reactive to light. Right eye exhibits no discharge. Left eye exhibits no discharge.  Normal eyelids bilaterally. Conjunctivae is injected bilaterally. There is purulent drainage bilaterally. No pain with extraocular movements.  Pulmonary/Chest: Effort normal. No respiratory distress.  Neurological: He is alert. Coordination normal.  Skin: No rash noted. He is not diaphoretic.  Psychiatric: He has a normal mood and affect. His behavior is normal.  Nursing note and vitals reviewed.   ED Course  Procedures  DIAGNOSTIC STUDIES: Oxygen Saturation is 98% on RA, normal by my interpretation.  COORDINATION OF CARE:  10:39 AM Discussed treatment plan which includes visual acuity, eye drops with pt at bedside and pt agreed to plan.  Labs Review Labs Reviewed - No data to display  Imaging Review No results found. I have personally reviewed and evaluated these images and lab  results as part of my medical decision-making.   EKG Interpretation None      Visual Acuity    Bilateral Near             Bilateral Distance  20/30           R Near             R Distance  20/30           L Near             L Distance  20/50                MDM   Final diagnoses:  Bilateral conjunctivitis    Patient with bilateral conjunctival injection, purulent drainage. No injury to the eyes. No pain.  No photophobia. No pain with extraocular movements. Normal eyelids bilaterally. Exam is most consistent with viral/allergic/bacterial conjunctivitis. Will start October decks ointment. Follow-up with ophthalmology as needed. Return precautions discussed.  Filed Vitals:   12/18/14 1047  BP: 118/86  Pulse: 88  Temp: 98.9 F (37.2 C)  TempSrc: Oral  Resp: 18  Height: 5\' 6"  (1.676 m)  Weight: 145 lb (65.772 kg)  SpO2: 98%    I personally performed the services described in this documentation, which was scribed in my presence. The recorded information has been reviewed and is accurate.   Jeannett Senior, PA-C 12/18/14 [place], MD 12/18/14 1535

## 2014-12-18 NOTE — ED Notes (Signed)
Pt presents with swelling to upper and lower lids Bilateral. Pt does not wear contacts.

## 2014-12-18 NOTE — Discharge Instructions (Signed)
Apply ointment every 6 hours to both eyes. Start taking Claritin for allergy symptoms. Try cool compresses. Follow-up with ophthalmologist as referred. Return if worsening symptoms.   Bacterial Conjunctivitis Bacterial conjunctivitis (commonly called pink eye) is redness, soreness, or puffiness (inflammation) of the white part of your eye. It is caused by a germ called bacteria. These germs can easily spread from person to person (contagious). Your eye often will become red or pink. Your eye may also become irritated, watery, or have a thick discharge.  HOME CARE   Apply a cool, clean washcloth over closed eyelids. Do this for 10-20 minutes, 3-4 times a day while you have pain.  Gently wipe away any fluid coming from the eye with a warm, wet washcloth or cotton ball.  Wash your hands often with soap and water. Use paper towels to dry your hands.  Do not share towels or washcloths.  Change or wash your pillowcase every day.  Do not use eye makeup until the infection is gone.  Do not use machines or drive if your vision is blurry.  Stop using contact lenses. Do not use them again until your doctor says it is okay.  Do not touch the tip of the eye drop bottle or medicine tube with your fingers when you put medicine on the eye. GET HELP RIGHT AWAY IF:   Your eye is not better after 3 days of starting your medicine.  You have a yellowish fluid coming out of the eye.  You have more pain in the eye.  Your eye redness is spreading.  Your vision becomes blurry.  You have a fever or lasting symptoms for more than 2-3 days.  You have a fever and your symptoms suddenly get worse.  You have pain in the face.  Your face gets red or puffy (swollen). MAKE SURE YOU:   Understand these instructions.  Will watch this condition.  Will get help right away if you are not doing well or get worse.   This information is not intended to replace advice given to you by your health care  provider. Make sure you discuss any questions you have with your health care provider.   Document Released: 12/05/2007 Document Revised: 02/12/2012 Document Reviewed: 11/01/2011 Elsevier Interactive Patient Education [employer].

## 2014-12-18 NOTE — ED Notes (Signed)
Declined W/C at D/C and was escorted to lobby by RN. 

## 2015-12-12 IMAGING — CR DG CHEST 2V
2 series · 2 of 2 positions shown · non-contrast
Comparison: June 03, 2013

CLINICAL DATA: Intermittent chest pain for several weeks

EXAM:
CHEST  2 VIEW

[chest pa]
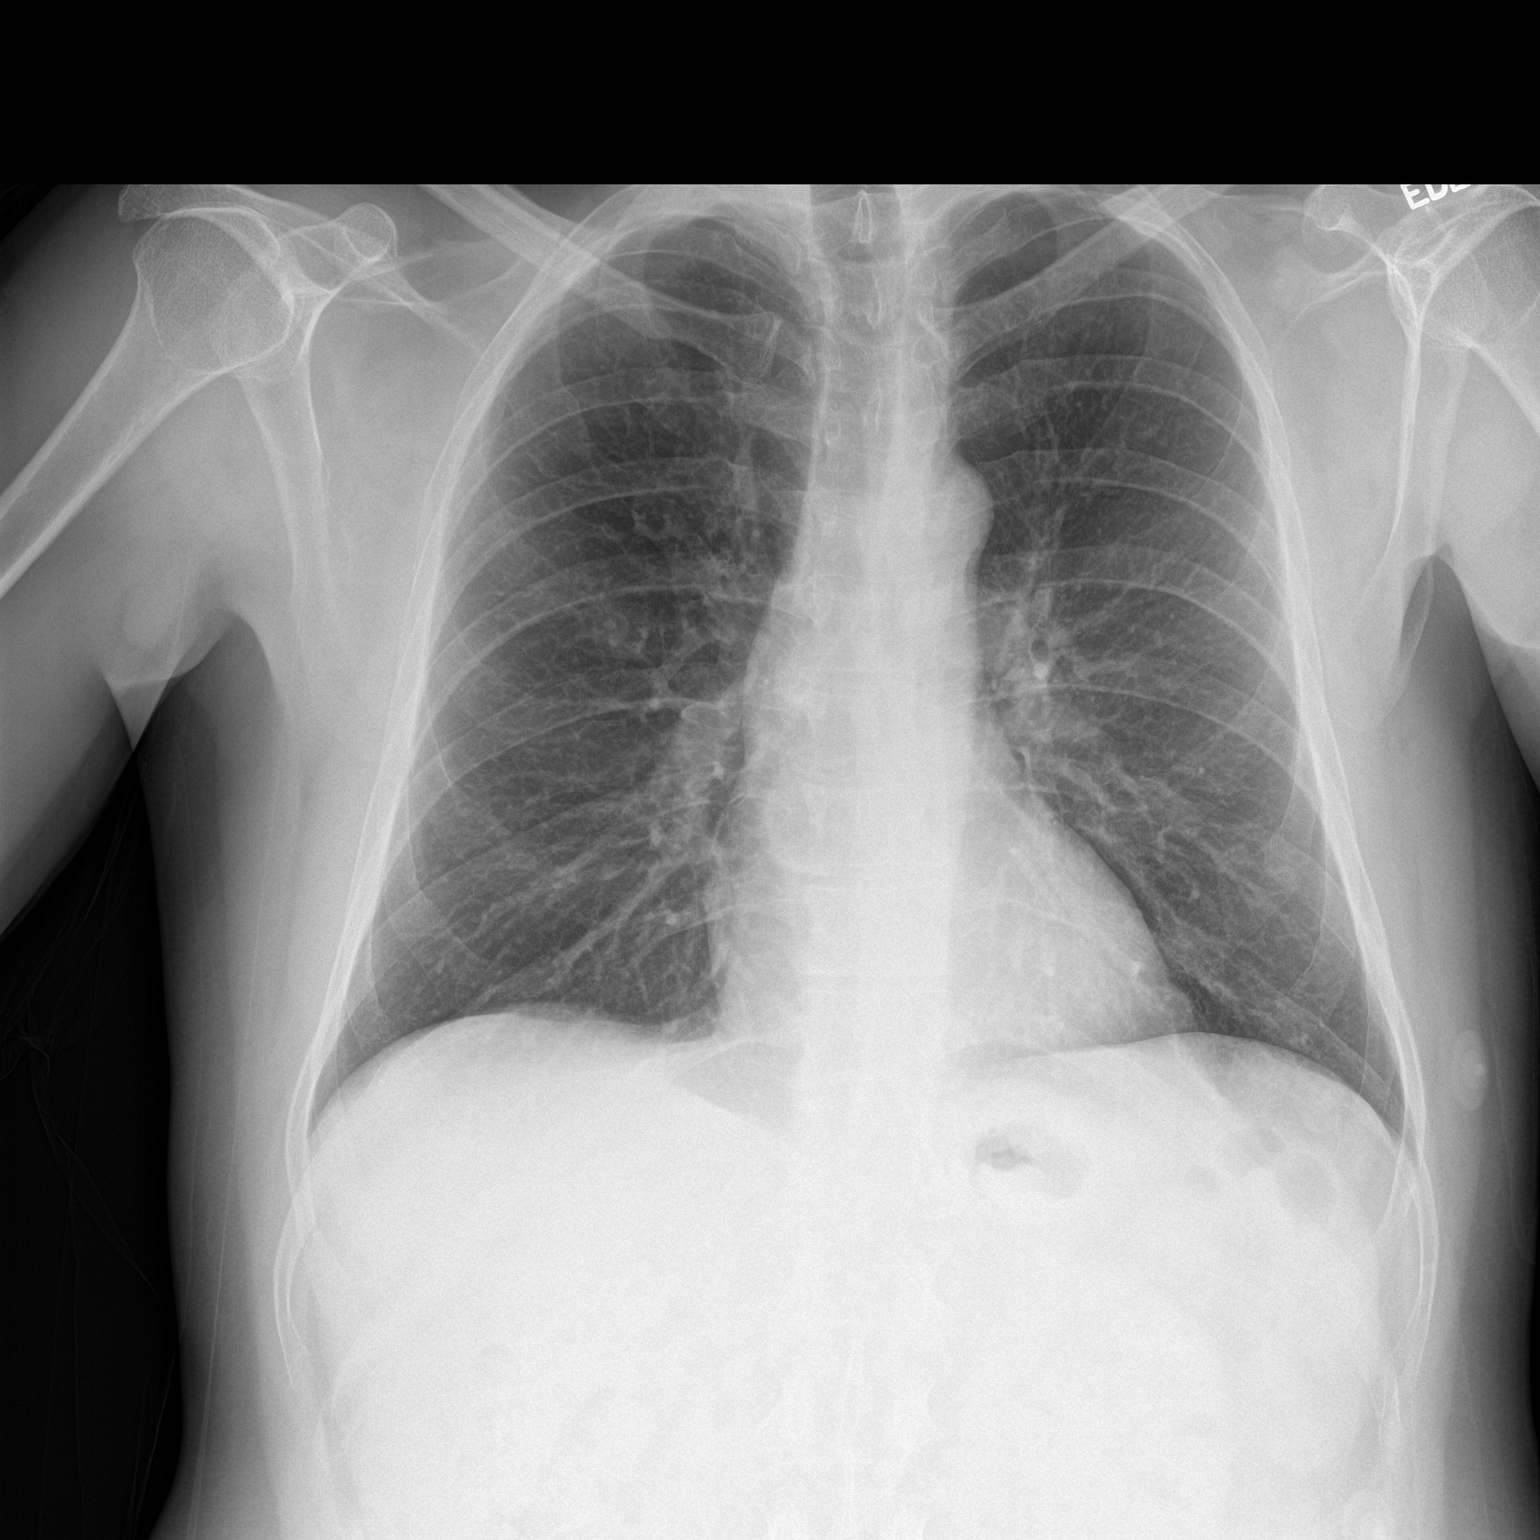

[chest lat]
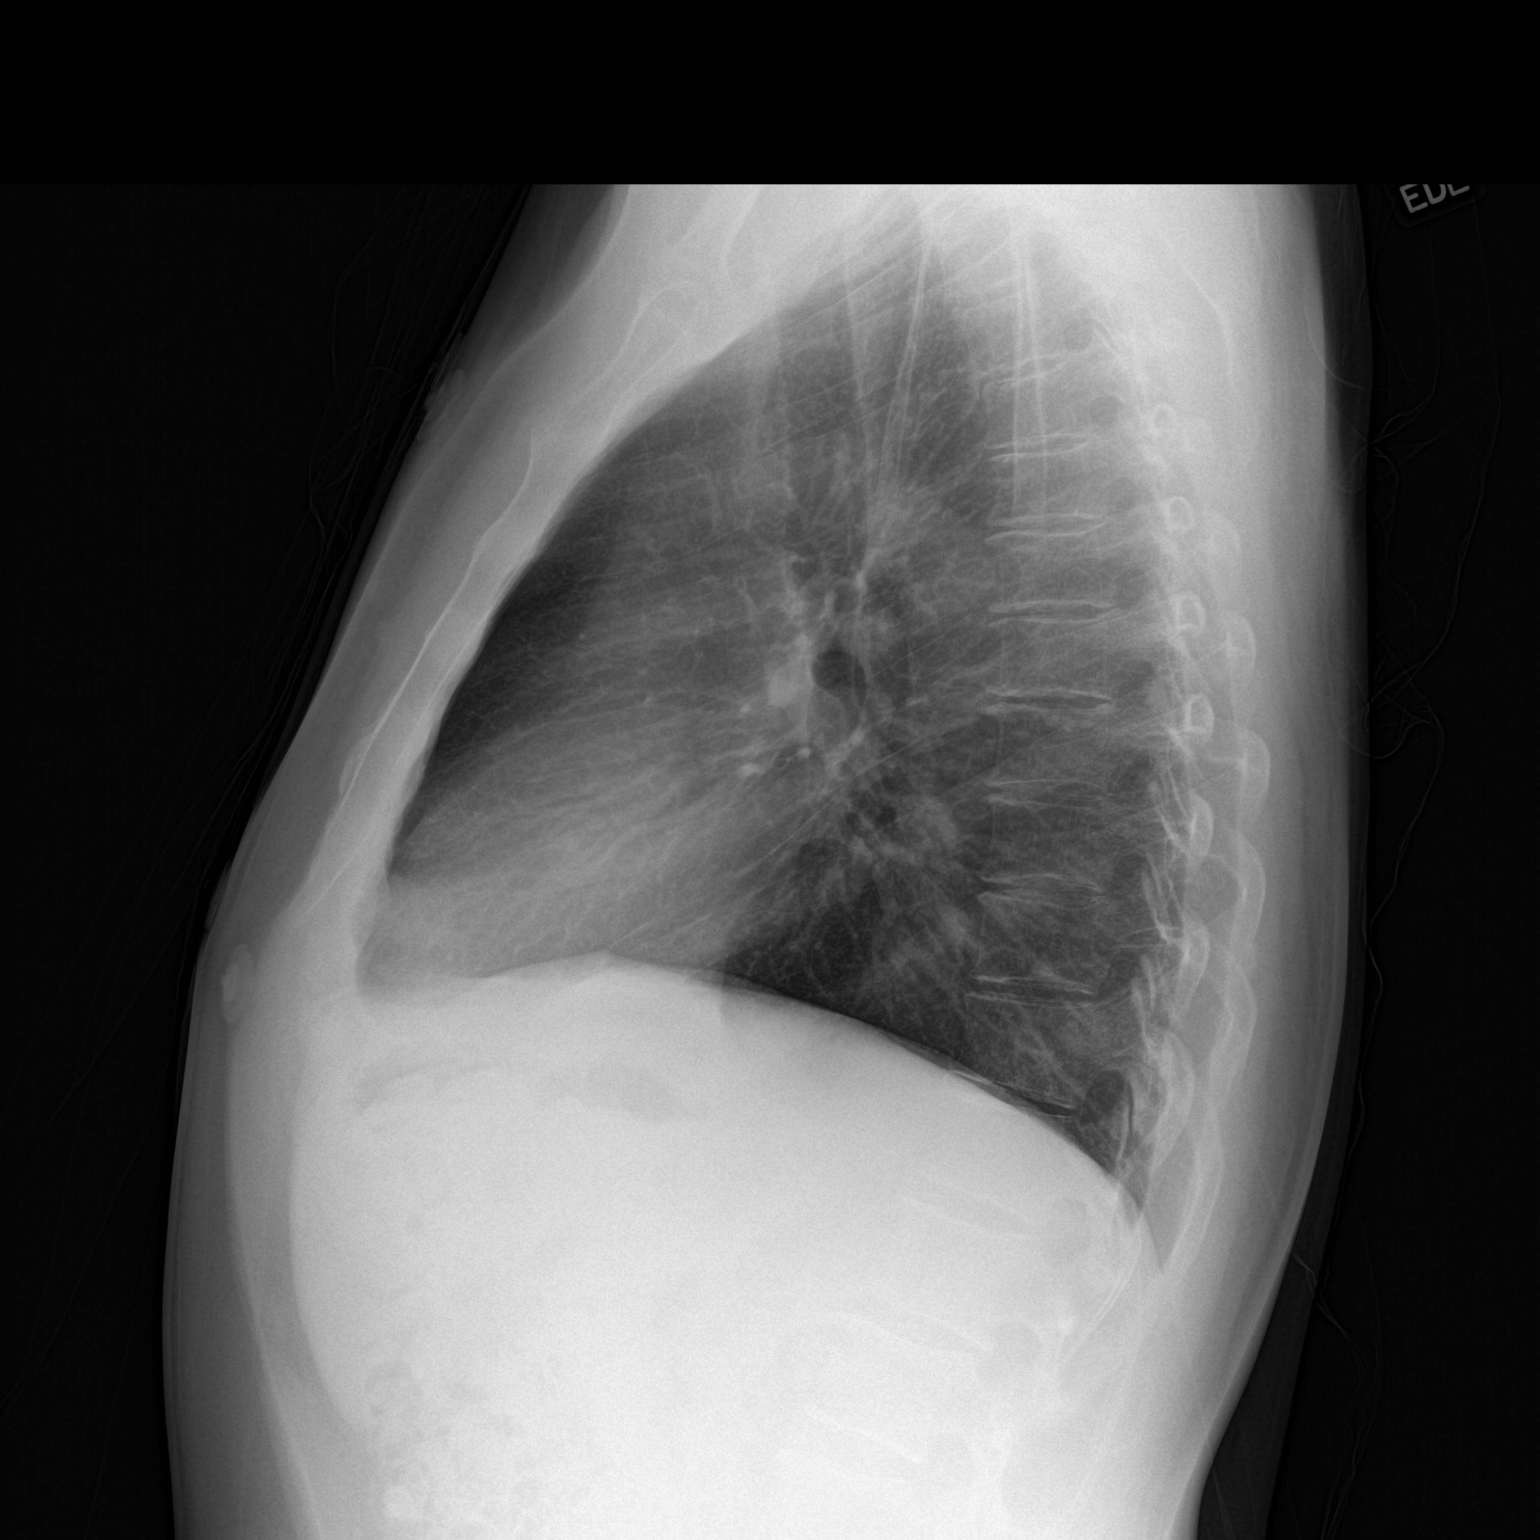

[2 of 2 positions shown; findings below may reference images not displayed]

FINDINGS: There is no edema or consolidation. The heart size and pulmonary
vascularity normal. No adenopathy. No pneumothorax. No bone lesions.
IMPRESSION: No abnormality noted.

## 2019-10-03 ENCOUNTER — Ambulatory Visit (HOSPITAL_COMMUNITY)
Admission: EM | Admit: 2019-10-03 | Discharge: 2019-10-03 | Disposition: A | Discharge status: Home / Self Care | Payer: 59

## 2019-10-03 ENCOUNTER — Encounter (HOSPITAL_COMMUNITY): Payer: Self-pay | Admitting: Emergency Medicine

## 2019-10-03 ENCOUNTER — Other Ambulatory Visit: Payer: Self-pay

## 2019-10-03 DIAGNOSIS — L0291 Cutaneous abscess, unspecified: Secondary | ICD-10-CM | POA: Diagnosis not present

## 2019-10-03 MED ORDER — DOXYCYCLINE HYCLATE 100 MG PO CAPS: 100.0000 mg | ORAL_CAPSULE | Freq: Two times a day (BID) | ORAL | 0 refills | Status: DC

## 2019-10-03 NOTE — Discharge Instructions (Addendum)
Warm compresses to areas with fluid to allow for drainage.  Complete course of antibiotics.  If symptoms worsen or do not improve in the next week to return to be seen or to follow up with your PCP.

## 2019-10-03 NOTE — ED Provider Notes (Signed)
[place]    CSN: [id number] Arrival date & time: 10/03/19  1602      History   Chief Complaint Chief Complaint  Patient presents with  . Wound Infection    HPI Roberto Fisher is a 49 y.o. male.   Roberto Fisher presents with complaints of  sores/ boils which are scattered to his extremities over the past 4 weeks. He states they have been related to yellow jacket bites as well as mosquito bites, which have since become infected. No fevers. No gi symptoms. History of large abscess with staph aureas, which required surgical debridement. Denies any issues since then. Denies drug use.    ROS per HPI, negative if not otherwise mentioned.      Past Medical History:  Diagnosis Date  . Medical history non-contributory     Patient Active Problem List   Diagnosis Date Noted  . Abscess of abdominal wall 07/09/2013  . Sepsis ([place]) 06/04/2013  . Cellulitis of left abdominal wall 06/03/2013    Past Surgical History:  Procedure Laterality Date  . INCISION AND DRAINAGE ABSCESS N/A 06/06/2013   Procedure: INCISION AND DRAINAGE DEBRIDEMENT OF ABDOMINAL WALL;  Surgeon: Imogene Burn. Georgette Dover, MD;  Location: [facility];  Service: General;  Laterality: N/A;  . NO PAST SURGERIES         Home Medications    Prior to Admission medications   Medication Sig Start Date End Date Taking? Authorizing Provider  Multiple Vitamin (MULTIVITAMIN) tablet Take 1 tablet by mouth daily.   Yes [provider]  doxycycline (VIBRAMYCIN) 100 MG capsule Take 1 capsule (100 mg total) by mouth 2 (two) times daily. 10/03/19   Zigmund Gottron, NP  loratadine (CLARITIN) 10 MG tablet Take 1 tablet (10 mg total) by mouth daily. 12/18/14   Kirichenko, Tatyana, PA-C  LORazepam (ATIVAN) 1 MG tablet Take 1 tablet (1 mg total) by mouth every 8 (eight) hours as needed for anxiety. 10/12/14   Montine Circle, PA-C  OVER THE COUNTER MEDICATION Take 1 tablet by mouth at bedtime as needed (for sleep).  Medication : over the counter "calming vitamin"    [provider]    Family History Family History  Problem Relation Age of Onset  . Hypertension Mother   . Diabetes Mellitus II Father     Social History Social History   Tobacco Use  . Smoking status: Never Smoker  Substance Use Topics  . Alcohol use: No  . Drug use: No     Allergies   Patient has no known allergies.   Review of Systems Review of Systems   Physical Exam Triage Vital Signs ED Triage Vitals  Enc Vitals Group     BP 10/03/19 1700 (!) 140/84     Pulse Rate 10/03/19 1700 89     Resp 10/03/19 1700 20     Temp 10/03/19 1700 98.7 F (37.1 C)     Temp Source 10/03/19 1700 Oral     SpO2 10/03/19 1700 100 %     Weight --      Height --      Head Circumference --      Peak Flow --      Pain Score 10/03/19 1658 0     Pain Loc --      Pain Edu? --      Excl. in [place]? --    No data found.  Updated Vital Signs BP (!) 140/84 (BP Location: Right Arm)   Pulse  89   Temp 98.7 F (37.1 C) (Oral)   Resp 20   SpO2 100%   Visual Acuity Right Eye Distance:   Left Eye Distance:   Bilateral Distance:    Right Eye Near:   Left Eye Near:    Bilateral Near:     Physical Exam Constitutional:      Appearance: He is well-developed.  Cardiovascular:     Rate and Rhythm: Normal rate.  Pulmonary:     Effort: Pulmonary effort is normal.  Skin:    General: Skin is warm and dry.     Comments: Various sized abscesses in various stages of healing, some with small amount of fluid collection, scattered to extremities  Neurological:     Mental Status: He is alert and oriented to person, place, and time.      UC Treatments / Results  Labs (all labs ordered are listed, but only abnormal results are displayed) Labs Reviewed - No data to display  EKG   Radiology No results found.  Procedures Procedures (including critical care time)  Medications Ordered in UC Medications - No data to  display  Initial Impression / Assessment and Plan / UC Course  I have reviewed the triage vital signs and the nursing notes.  Pertinent labs & imaging results that were available during my care of the patient were reviewed by me and considered in my medical decision making (see chart for details).    Large number of boils and small abscesses scattered to all 4 extremities in various stages of healing. A few with small pustules. Antibiotics provided, encouraged warm compresses to areas which could drain. Discouraged picking. Return precautions provided. Patient verbalized understanding and agreeable to plan.   Final Clinical Impressions(s) / UC Diagnoses   Final diagnoses:  Abscess     Discharge Instructions     Warm compresses to areas with fluid to allow for drainage.  Complete course of antibiotics.  If symptoms worsen or do not improve in the next week to return to be seen or to follow up with your PCP.     ED Prescriptions    Medication Sig Dispense Auth. Provider   doxycycline (VIBRAMYCIN) 100 MG capsule Take 1 capsule (100 mg total) by mouth 2 (two) times daily. 20 capsule Zigmund Gottron, NP     PDMP not reviewed this encounter.   Zigmund Gottron, NP 10/04/19 [id number]

## 2019-10-03 NOTE — ED Triage Notes (Signed)
Patient has many scabbed sores all over arms and legs. These have been present for 5 weeks.  Patient does Biomedical scientist

## 2020-02-18 ENCOUNTER — Ambulatory Visit: Payer: 59 | Attending: Physician Assistant | Admitting: Physician Assistant

## 2020-02-18 ENCOUNTER — Encounter: Payer: Self-pay | Admitting: Physician Assistant

## 2020-02-18 ENCOUNTER — Other Ambulatory Visit: Payer: Self-pay

## 2020-02-18 VITALS — BP 155/82 | HR 100 | Temp 98.7°F | Resp 18 | Ht 64.0 in | Wt 163.0 lb

## 2020-02-18 DIAGNOSIS — R0602 Shortness of breath: Secondary | ICD-10-CM | POA: Diagnosis not present

## 2020-02-18 DIAGNOSIS — Z114 Encounter for screening for human immunodeficiency virus [HIV]: Secondary | ICD-10-CM

## 2020-02-18 DIAGNOSIS — Z125 Encounter for screening for malignant neoplasm of prostate: Secondary | ICD-10-CM

## 2020-02-18 DIAGNOSIS — R030 Elevated blood-pressure reading, without diagnosis of hypertension: Secondary | ICD-10-CM

## 2020-02-18 DIAGNOSIS — Z1211 Encounter for screening for malignant neoplasm of colon: Secondary | ICD-10-CM

## 2020-02-18 DIAGNOSIS — Z7722 Contact with and (suspected) exposure to environmental tobacco smoke (acute) (chronic): Secondary | ICD-10-CM

## 2020-02-18 DIAGNOSIS — Z13220 Encounter for screening for lipoid disorders: Secondary | ICD-10-CM

## 2020-02-18 DIAGNOSIS — Z1159 Encounter for screening for other viral diseases: Secondary | ICD-10-CM

## 2020-02-18 DIAGNOSIS — R197 Diarrhea, unspecified: Secondary | ICD-10-CM

## 2020-02-18 NOTE — Patient Instructions (Addendum)
For your diarrhea, I encourage you to increase your fiber intake, keep a diary of foods that you are eating that may be causing you to have loose stools.  I will be starting a referral for you to see gastroenterology to have your colonoscopy for colon cancer screening.   Please return to the clinic for fasting labs, we will call you with the results.   Your blood pressure was slightly elevated today, I encourage you to check your blood pressure at home, keep a written log, and bring this with you to a follow-up appointment in approximately 2-4 weeks for further evaluation.  At that time we will also further evaluate your loose stools  Kennieth Rad, PA-C Phys[facility] [email or web address]   High-Fiber Diet Fiber, also called dietary fiber, is a type of carbohydrate that is found in fruits, vegetables, whole grains, and beans. A high-fiber diet can have many health benefits. Your health care provider may recommend a high-fiber diet to help:  Prevent constipation. Fiber can make your bowel movements more regular.  Lower your cholesterol.  Relieve the following conditions: ? Swelling of veins in the anus (hemorrhoids). ? Swelling and irritation (inflammation) of specific areas of the digestive tract (uncomplicated diverticulosis). ? A problem of the large intestine (colon) that sometimes causes pain and diarrhea (irritable bowel syndrome, IBS).  Prevent overeating as part of a weight-loss plan.  Prevent heart disease, type 2 diabetes, and certain cancers. What is my plan? The recommended daily fiber intake in grams (g) includes:  38 g for men age 62 or younger.  30 g for men over age 89.  55 g for women age 28 or younger.  21 g for women over age 14. You can get the recommended daily intake of dietary fiber by:  Eating a variety of fruits, vegetables, grains, and beans.  Taking a fiber supplement, if it  is not possible to get enough fiber through your diet. What do I need to know about a high-fiber diet?  It is better to get fiber through food sources rather than from fiber supplements. There is not a lot of research about how effective supplements are.  Always check the fiber content on the nutrition facts label of any prepackaged food. Look for foods that contain 5 g of fiber or more per serving.  Talk with a diet and nutrition specialist (dietitian) if you have questions about specific foods that are recommended or not recommended for your medical condition, especially if those foods are not listed below.  Gradually increase how much fiber you consume. If you increase your intake of dietary fiber too quickly, you may have bloating, cramping, or gas.  Drink plenty of water. Water helps you to digest fiber. What are tips for following this plan?  Eat a wide variety of high-fiber foods.  Make sure that half of the grains that you eat each day are whole grains.  Eat breads and cereals that are made with whole-grain flour instead of refined flour or white flour.  Eat brown rice, bulgur wheat, or millet instead of white rice.  Start the day with a breakfast that is high in fiber, such as a cereal that contains 5 g of fiber or more per serving.  Use beans in place of meat in soups, salads, and pasta dishes.  Eat high-fiber snacks, such as berries, raw vegetables, nuts, and popcorn.  Choose whole fruits and vegetables instead of processed forms like juice or sauce. What foods  can I eat?  Fruits Berries. Pears. Apples. Oranges. Avocado. Prunes and raisins. Dried figs. Vegetables Sweet potatoes. Spinach. Kale. Artichokes. Cabbage. Broccoli. Cauliflower. Green peas. Carrots. Squash. Grains Whole-grain breads. Multigrain cereal. Oats and oatmeal. Brown rice. Barley. Bulgur wheat. [place]. Quinoa. Bran muffins. Popcorn. Rye wafer crackers. Meats and other proteins Navy, kidney, and pinto  beans. Soybeans. Split peas. Lentils. Nuts and seeds. Dairy Fiber-fortified yogurt. Beverages Fiber-fortified soy milk. Fiber-fortified orange juice. Other foods Fiber bars. The items listed above may not be a complete list of recommended foods and beverages. Contact a dietitian for more options. What foods are not recommended? Fruits Fruit juice. Cooked, strained fruit. Vegetables Fried potatoes. Canned vegetables. Well-cooked vegetables. Grains White bread. Pasta made with refined flour. White rice. Meats and other proteins Fatty cuts of meat. Fried chicken or fried fish. Dairy Milk. Yogurt. Cream cheese. Sour cream. Fats and oils Butters. Beverages Soft drinks. Other foods Cakes and pastries. The items listed above may not be a complete list of foods and beverages to avoid. Contact a dietitian for more information. Summary  Fiber is a type of carbohydrate. It is found in fruits, vegetables, whole grains, and beans.  There are many health benefits of eating a high-fiber diet, such as preventing constipation, lowering blood cholesterol, helping with weight loss, and reducing your risk of heart disease, diabetes, and certain cancers.  Gradually increase your intake of fiber. Increasing too fast can result in cramping, bloating, and gas. Drink plenty of water while you increase your fiber.  The best sources of fiber include whole fruits and vegetables, whole grains, nuts, seeds, and beans. This information is not intended to replace advice given to you by your health care provider. Make sure you discuss any questions you have with your health care provider. Document Revised: 12/30/2016 Document Reviewed: 12/30/2016 Elsevier Patient Education  2020 [employer].    How to Take Your Blood Pressure Blood pressure is a measurement of how strongly your blood is pressing against the walls of your arteries. Arteries are blood vessels that carry blood from your heart throughout your  body. Your health care provider takes your blood pressure at each office visit. You can also take your own blood pressure at home with a blood pressure machine. You may need to take your own blood pressure:  To confirm a diagnosis of high blood pressure (hypertension).  To monitor your blood pressure over time.  To make sure your blood pressure medicine is working. Supplies needed: To take your blood pressure, you will need a blood pressure machine. You can buy a blood pressure machine, or blood pressure monitor, at most drugstores or online. There are several types of home blood pressure monitors. When choosing one, consider the following:  Choose a monitor that has an arm cuff.  Choose a cuff that wraps snugly around your upper arm. You should be able to fit only one finger between your arm and the cuff.  Do not choose a monitor that measures your blood pressure from your wrist or finger. Your health care provider can suggest a reliable monitor that will meet your needs. How to prepare To get the most accurate reading, avoid the following for 30 minutes before you check your blood pressure:  Drinking caffeine.  Drinking alcohol.  Eating.  Smoking.  Exercising. Five minutes before you check your blood pressure:  Empty your bladder.  Sit quietly without talking in a dining chair, rather than in a soft couch or armchair. How to take your  blood pressure To check your blood pressure, follow the instructions in the manual that came with your blood pressure monitor. If you have a digital blood pressure monitor, the instructions may be as follows: 1. Sit up straight. 2. Place your feet on the floor. Do not cross your ankles or legs. 3. Rest your left arm at the level of your heart on a table or desk or on the arm of a chair. 4. Pull up your shirt sleeve. 5. Wrap the blood pressure cuff around the upper part of your left arm, 1 inch (2.5 cm) above your elbow. It is best to wrap the  cuff around bare skin. 6. Fit the cuff snugly around your arm. You should be able to place only one finger between the cuff and your arm. 7. Position the cord inside the groove of your elbow. 8. Press the power button. 9. Sit quietly while the cuff inflates and deflates. 10. Read the digital reading on the monitor screen and write it down (record it). 11. Wait 2-3 minutes, then repeat the steps, starting at step 1. What does my blood pressure reading mean? A blood pressure reading consists of a higher number over a lower number. Ideally, your blood pressure should be below 120/80. The first ("top") number is called the systolic pressure. It is a measure of the pressure in your arteries as your heart beats. The second ("bottom") number is called the diastolic pressure. It is a measure of the pressure in your arteries as the heart relaxes. Blood pressure is classified into four stages. The following are the stages for adults who do not have a short-term serious illness or a chronic condition. Systolic pressure and diastolic pressure are measured in a unit called mm Hg. Normal  Systolic pressure: below 893.  Diastolic pressure: below 80. Elevated  Systolic pressure: 734-287.  Diastolic pressure: below 80. Hypertension stage 1  Systolic pressure: 681-157.  Diastolic pressure: 26-20. Hypertension stage 2  Systolic pressure: 355 or above.  Diastolic pressure: 90 or above. You can have prehypertension or hypertension even if only the systolic or only the diastolic number in your reading is higher than normal. Follow these instructions at home:  Check your blood pressure as often as recommended by your health care provider.  Take your monitor to the next appointment with your health care provider to make sure: ? That you are using it correctly. ? That it provides accurate readings.  Be sure you understand what your goal blood pressure numbers are.  Tell your health care provider if  you are having any side effects from blood pressure medicine. Contact a health care provider if:  Your blood pressure is consistently high. Get help right away if:  Your systolic blood pressure is higher than 180.  Your diastolic blood pressure is higher than 110. This information is not intended to replace advice given to you by your health care provider. Make sure you discuss any questions you have with your health care provider. Document Revised: 02/07/2017 Document Reviewed: 08/04/2015 Elsevier Patient Education  2020 [employer].

## 2020-02-18 NOTE — Progress Notes (Signed)
New Patient Office Visit  Subjective:  Patient ID: Roberto Fisher, male    DOB: Feb 15, 1971  Age: 49 y.o. MRN: [id number]  CC:  Chief Complaint  Patient presents with  . Annual Exam    HPI Roberto Fisher reports that he has not had a health physical in the past 33 years.  Presents with several complaints.  Reports that he was recently seen at urgent care for a sinus infection, was prescribed Augmentin and is compliant to the medication.  Does feel that he is improving.  Reports that he has diarrhea on an every other day basis, states this was present prior to taking the antibiotics, states this has been present for the last 10 to 12 months.  Endorses abdominal cramping, but states not with every episode, states episodes not immediately after eating.  Does endorse normal stools as well.  States he drinks approximately 4 to 5 glasses of water a day, states fiber intake is "good"  Reports approximate twice a month he will awaken with shortness of breath, states that he has to sit up and episode will stop after approximately 10 to 20 minutes.  Denies any previous diagnosis of asthma, does endorse that he was exposed to secondhand smoke for the past 48 years.    Past Medical History:  Diagnosis Date  . Medical history non-contributory     Past Surgical History:  Procedure Laterality Date  . INCISION AND DRAINAGE ABSCESS N/A 06/06/2013   Procedure: INCISION AND DRAINAGE DEBRIDEMENT OF ABDOMINAL WALL;  Surgeon: Imogene Burn. Georgette Dover, MD;  Location: [facility];  Service: General;  Laterality: N/A;  . NO PAST SURGERIES      Family History  Problem Relation Age of Onset  . Hypertension Mother   . Diabetes Mellitus II Father     Social History   Socioeconomic History  . Marital status: Single    Spouse name: Not on file  . Number of children: Not on file  . Years of education: Not on file  . Highest education level: Not on file  Occupational History  . Not on file  Tobacco Use   . Smoking status: Never Smoker  . Smokeless tobacco: Not on file  Substance and Sexual Activity  . Alcohol use: No  . Drug use: No  . Sexual activity: Not on file  Other Topics Concern  . Not on file  Social History Narrative  . Not on file   Social Determinants of Health   Financial Resource Strain: Not on file  Food Insecurity: Not on file  Transportation Needs: Not on file  Physical Activity: Not on file  Stress: Not on file  Social Connections: Not on file  Intimate Partner Violence: Not on file    ROS Review of Systems  Constitutional: Negative.   HENT: Negative.   Eyes: Negative.   Respiratory: Positive for shortness of breath. Negative for cough, chest tightness and wheezing.   Cardiovascular: Negative for chest pain and palpitations.  Gastrointestinal: Positive for diarrhea. Negative for abdominal pain and blood in stool.  Endocrine: Negative.   Genitourinary: Negative.   Musculoskeletal: Negative.   Skin: Negative.   Allergic/Immunologic: Negative.   Neurological: Negative.   Hematological: Negative.   Psychiatric/Behavioral: Negative.     Objective:   Today's Vitals: BP (!) 155/82 (BP Location: Right Arm, Patient Position: Sitting, Cuff Size: Normal)   Pulse 100   Temp 98.7 F (37.1 C) (Oral)   Resp 18   Ht 5\' 4"  (1.626 m)  Wt 163 lb (73.9 kg)   SpO2 96%   BMI 27.98 kg/m   Physical Exam Vitals and nursing note reviewed.   BP (!) 155/82 (BP Location: Right Arm, Patient Position: Sitting, Cuff Size: Normal)   Pulse 100   Temp 98.7 F (37.1 C) (Oral)   Resp 18   Ht 5\' 4"  (1.626 m)   Wt 163 lb (73.9 kg)   SpO2 96%   BMI 27.98 kg/m   General Appearance:    Alert, cooperative, no distress, appears stated age  Head:    Normocephalic, without obvious abnormality, atraumatic  Eyes:    PERRL, conjunctiva/corneas clear, EOM's intact, fundi    benign, both eyes       Ears:    Normal TM's and external ear canals, both ears  Nose:   Nares normal,  septum midline, mucosa normal, no drainage   or sinus tenderness  Throat:   Lips, mucosa, and tongue normal; teeth and gums normal  Neck:   Supple, symmetrical, trachea midline, no adenopathy;       thyroid:  No enlargement/tenderness/nodules; no carotid   bruit or JVD  Back:     Symmetric, no curvature, ROM normal, no CVA tenderness  Lungs:     Clear to auscultation bilaterally, respirations unlabored  Chest wall:    No tenderness or deformity  Heart:    Regular rate and rhythm, S1 and S2 normal, no murmur, rub   or gallop  Abdomen:     Soft, non-tender, bowel sounds active all four quadrants,    no masses, no organomegaly         Extremities:   Extremities normal, atraumatic, no cyanosis or edema  Pulses:   2+ and symmetric all extremities  Skin:   Skin color, texture, turgor normal, no rashes or lesions  Lymph nodes:   Cervical, supraclavicular, and axillary nodes normal  Neurologic:   CNII-XII intact. Normal strength, sensation and reflexes      throughout     Assessment & Plan:   Problem List Items Addressed This Visit   None     Outpatient Encounter Medications as of 02/18/2020  Medication Sig  . amoxicillin-clavulanate (AUGMENTIN) 875-125 MG tablet Take 1 tablet by mouth 2 (two) times daily.  . Multiple Vitamin (MULTIVITAMIN) tablet Take 1 tablet by mouth daily.  . [DISCONTINUED] loratadine (CLARITIN) 10 MG tablet Take 1 tablet (10 mg total) by mouth daily.  Marland Kitchen OVER THE COUNTER MEDICATION Take 1 tablet by mouth at bedtime as needed (for sleep). Medication : over the counter "calming vitamin" (Patient not taking: Reported on 02/18/2020)  . [DISCONTINUED] doxycycline (VIBRAMYCIN) 100 MG capsule Take 1 capsule (100 mg total) by mouth 2 (two) times daily.  . [DISCONTINUED] LORazepam (ATIVAN) 1 MG tablet Take 1 tablet (1 mg total) by mouth every 8 (eight) hours as needed for anxiety.   No facility-administered encounter medications on file as of 02/18/2020.  1. Elevated  blood pressure reading in office without diagnosis of hypertension Encourage patient to check blood pressure at home, keep a written log, bring blood pressure readings to next office visit. - Comp. Metabolic Panel (12); Future  2. Diarrhea, unspecified type Patient encouraged to track fiber on a daily basis for the next couple weeks, patient education given on a high-fiber diet, continue hydration, encouraged to keep diary of food groups that may be attributing to symptoms - CBC with Differential/Platelet; Future - Comp. Metabolic Panel (12); Future - TSH; Future  3. SOB (shortness of breath)  Patient encouraged to keep log of shortness of breath episodes, chest x-ray ordered  4. Screen for colon cancer  - Ambulatory referral to Gastroenterology  5. Screening, lipid  - Lipid panel; Future  6. Screening for HIV without presence of risk factors  - HIV antibody (with reflex); Future  7. Encounter for HCV screening test for low risk patient  - HCV Ab w/Rflx to Verification; Future  8. History of second hand smoke exposure   9. Screening for prostate cancer  - PSA; Future  Patient to return for fasting labs, patient given appointment [facility]   I have reviewed the patient's medical history (PMH, PSH, Social History, Family History, Medications, and allergies) , and have been updated if relevant. I spent 45 minutes reviewing chart and  face to face time with patient.     Follow-up: No follow-ups on file.   Loraine Grip Mayers, PA-C

## 2020-02-18 NOTE — Progress Notes (Signed)
Patient has eaten today and patient has eaten today. Patient denies pain at this time. Patient complains of diarrhea EOD. Patient is currently being treated for URI. Patient complains of HAs intermittently.

## 2020-02-20 ENCOUNTER — Encounter: Payer: Self-pay | Admitting: Physician Assistant

## 2020-02-20 DIAGNOSIS — R197 Diarrhea, unspecified: Secondary | ICD-10-CM | POA: Insufficient documentation

## 2020-02-20 DIAGNOSIS — R030 Elevated blood-pressure reading, without diagnosis of hypertension: Secondary | ICD-10-CM | POA: Insufficient documentation

## 2020-02-20 DIAGNOSIS — I10 Essential (primary) hypertension: Secondary | ICD-10-CM | POA: Insufficient documentation

## 2020-02-20 DIAGNOSIS — Z7722 Contact with and (suspected) exposure to environmental tobacco smoke (acute) (chronic): Secondary | ICD-10-CM | POA: Insufficient documentation

## 2020-02-20 DIAGNOSIS — R0609 Other forms of dyspnea: Secondary | ICD-10-CM | POA: Insufficient documentation

## 2020-02-20 DIAGNOSIS — R0602 Shortness of breath: Secondary | ICD-10-CM | POA: Insufficient documentation

## 2020-02-21 ENCOUNTER — Ambulatory Visit: Payer: 59 | Attending: Family Medicine

## 2020-02-21 ENCOUNTER — Other Ambulatory Visit: Payer: Self-pay

## 2020-02-21 DIAGNOSIS — R197 Diarrhea, unspecified: Secondary | ICD-10-CM

## 2020-02-21 DIAGNOSIS — Z125 Encounter for screening for malignant neoplasm of prostate: Secondary | ICD-10-CM

## 2020-02-21 DIAGNOSIS — Z1159 Encounter for screening for other viral diseases: Secondary | ICD-10-CM

## 2020-02-21 DIAGNOSIS — R030 Elevated blood-pressure reading, without diagnosis of hypertension: Secondary | ICD-10-CM

## 2020-02-21 DIAGNOSIS — Z13220 Encounter for screening for lipoid disorders: Secondary | ICD-10-CM

## 2020-02-21 DIAGNOSIS — Z114 Encounter for screening for human immunodeficiency virus [HIV]: Secondary | ICD-10-CM

## 2020-02-22 LAB — CBC WITH DIFFERENTIAL/PLATELET
Basophils Absolute: 0 10*3/uL (ref 0.0–0.2)
Basos: 0 %
EOS (ABSOLUTE): 0.1 10*3/uL (ref 0.0–0.4)
Eos: 1 %
Hematocrit: 41.3 % (ref 37.5–51.0)
Hemoglobin: 13.9 g/dL (ref 13.0–17.7)
Immature Grans (Abs): 0 10*3/uL (ref 0.0–0.1)
Immature Granulocytes: 0 %
Lymphocytes Absolute: 1.9 10*3/uL (ref 0.7–3.1)
Lymphs: 21 %
MCH: 28 pg (ref 26.6–33.0)
MCHC: 33.7 g/dL (ref 31.5–35.7)
MCV: 83 fL (ref 79–97)
Monocytes Absolute: 0.6 10*3/uL (ref 0.1–0.9)
Monocytes: 7 %
Neutrophils Absolute: 6.3 10*3/uL (ref 1.4–7.0)
Neutrophils: 71 %
Platelets: 311 10*3/uL (ref 150–450)
RBC: 4.96 x10E6/uL (ref 4.14–5.80)
RDW: 12.4 % (ref 11.6–15.4)
WBC: 8.9 10*3/uL (ref 3.4–10.8)

## 2020-02-22 LAB — LIPID PANEL
Chol/HDL Ratio: 3.3 ratio (ref 0.0–5.0)
Cholesterol, Total: 171 mg/dL (ref 100–199)
HDL: 52 mg/dL (ref >39)
LDL Chol Calc (NIH): 109 mg/dL — ABNORMAL HIGH (ref 0–99)
Triglycerides: 49 mg/dL (ref 0–149)
VLDL Cholesterol Cal: 10 mg/dL (ref 5–40)

## 2020-02-22 LAB — COMP. METABOLIC PANEL (12)
AST: 15 IU/L (ref 0–40)
Albumin/Globulin Ratio: 1.6 (ref 1.2–2.2)
Albumin: 4.3 g/dL (ref 3.8–4.9)
Alkaline Phosphatase: 79 IU/L (ref 44–121)
BUN/Creatinine Ratio: 10 (ref 9–20)
BUN: 9 mg/dL (ref 6–24)
Bilirubin Total: 0.3 mg/dL (ref 0.0–1.2)
Calcium: 9.2 mg/dL (ref 8.7–10.2)
Chloride: 100 mmol/L (ref 96–106)
Creatinine, Ser: 0.91 mg/dL (ref 0.76–1.27)
GFR calc Af Amer: 112 mL/min/{1.73_m2} (ref >59)
GFR calc non Af Amer: 97 mL/min/{1.73_m2} (ref >59)
Globulin, Total: 2.7 g/dL (ref 1.5–4.5)
Glucose: 104 mg/dL — ABNORMAL HIGH (ref 65–99)
Potassium: 4.2 mmol/L (ref 3.5–5.2)
Sodium: 137 mmol/L (ref 134–144)
Total Protein: 7 g/dL (ref 6.0–8.5)

## 2020-02-22 LAB — HCV AB W/RFLX TO VERIFICATION: HCV Ab: <0.1 s/co ratio (ref 0.0–0.9)

## 2020-02-22 LAB — HIV ANTIBODY (ROUTINE TESTING W REFLEX): HIV Screen 4th Generation wRfx: NONREACTIVE

## 2020-02-22 LAB — HCV INTERPRETATION

## 2020-02-22 LAB — PSA: Prostate Specific Ag, Serum: 2.2 ng/mL (ref 0.0–4.0)

## 2020-02-22 LAB — TSH: TSH: 1.35 u[IU]/mL (ref 0.450–4.500)

## 2020-02-24 ENCOUNTER — Telehealth: Payer: Self-pay | Admitting: *Deleted

## 2020-02-24 NOTE — Telephone Encounter (Signed)
MA unable to reach patient to share normal results with him. Patient does need to limit cholesterol intake to address slightly elevated LDL.

## 2020-02-24 NOTE — Telephone Encounter (Signed)
-----   Message from Kennieth Rad, [place] sent at 02/22/2020  8:50 AM EST ----- Please call patient and let him know that his screening for hepatitis C, HIV and prostate cancer were negative. His thyroid, kidney and liver function are within normal limits.  His cholesterol overall is within normal limits, however his LDL is slightly elevated. His risk of a cardiovascular event in the next 10 years is 4%, he does not need to begin medication at this time, however I strongly recommend that he follow a low-cholesterol diet.   The 10-year ASCVD risk score Mikey Bussing DC Brooke Bonito., et al., 2013) is: 4.1%   Values used to calculate the score:     Age: 49 years     Sex: Male     Is Non-Hispanic African American: No     Diabetic: No     Tobacco smoker: No     Systolic Blood Pressure: 471 mmHg     Is BP treated: No     HDL Cholesterol: 52 mg/dL     Total Cholesterol: 171 mg/dL

## 2020-02-24 NOTE — Telephone Encounter (Signed)
Patient verified DOB Patient has been made aware of labs being normal and needing to adhere to low cholesterol to address LDL elevation.

## 2020-04-09 DIAGNOSIS — F429 Obsessive-compulsive disorder, unspecified: Secondary | ICD-10-CM | POA: Insufficient documentation

## 2020-04-09 NOTE — Progress Notes (Deleted)
   Subjective:    Patient ID: Roberto Fisher, male    DOB: [birth year], 50 y.o.   MRN: [id number]  49 y.o.M hx HTN here to est PCP  04/10/20 Saw mayers in 02/2020: Roberto Fisher reports that he has not had a health physical in the past 33 years.  Presents with several complaints.  Reports that he was recently seen at urgent care for a sinus infection, was prescribed Augmentin and is compliant to the medication.  Does feel that he is improving.  Reports that he has diarrhea on an every other day basis, states this was present prior to taking the antibiotics, states this has been present for the last 10 to 12 months.  Endorses abdominal cramping, but states not with every episode, states episodes not immediately after eating.  Does endorse normal stools as well.  States he drinks approximately 4 to 5 glasses of water a day, states fiber intake is "good"  Reports approximate twice a month he will awaken with shortness of breath, states that he has to sit up and episode will stop after approximately 10 to 20 minutes.  Denies any previous diagnosis of asthma, does endorse that he was exposed to secondhand smoke for the past 48 years.  Elevated blood pressure reading in office without diagnosis of hypertension Encourage patient to check blood pressure at home, keep a written log, bring blood pressure readings to next office visit. - Comp. Metabolic Panel (12); Future  2. Diarrhea, unspecified type Patient encouraged to track fiber on a daily basis for the next couple weeks, patient education given on a high-fiber diet, continue hydration, encouraged to keep diary of food groups that may be attributing to symptoms - CBC with Differential/Platelet; Future - Comp. Metabolic Panel (12); Future - TSH; Future  3. SOB (shortness of breath) Patient encouraged to keep log of shortness of breath episodes, chest x-ray ordered  4. Screen for colon cancer  - Ambulatory referral to  Gastroenterology  5. Screening, lipid  - Lipid panel; Future  6. Screening for HIV without presence of risk factors  - HIV antibody (with reflex); Future  7. Encounter for HCV screening test for low risk patient  - HCV Ab w/Rflx to Verification; Future  8. History of second hand smoke exposure   9. Screening for prostate cancer  - PSA; Future  Patient to return for fasting labs, patient given appointment [facility]   I have reviewed the patient's medical history (PMH, PSH, Social History, Family History, Medications, and allergies) , and have been updated if relevant. I spent 45 minutes reviewing chart and  face to face time with patient.  HCV HIV NEG  psa NEG  elev LDL  TFT normal. Cmet/cbc normal Needs Colon/flu Tdap   Was ref to GI for colonoscopy        Review of Systems     Objective:   Physical Exam        Assessment & Plan:

## 2020-04-10 ENCOUNTER — Ambulatory Visit: Payer: 59 | Admitting: Critical Care Medicine

## 2020-05-06 ENCOUNTER — Encounter (HOSPITAL_COMMUNITY): Payer: Self-pay | Admitting: *Deleted

## 2020-05-06 ENCOUNTER — Ambulatory Visit (HOSPITAL_COMMUNITY)
Admission: EM | Admit: 2020-05-06 | Discharge: 2020-05-06 | Disposition: A | Discharge status: Home / Self Care | Payer: 59 | Attending: Family Medicine | Admitting: Family Medicine

## 2020-05-06 ENCOUNTER — Other Ambulatory Visit: Payer: Self-pay

## 2020-05-06 DIAGNOSIS — R1013 Epigastric pain: Secondary | ICD-10-CM

## 2020-05-06 MED ORDER — PANTOPRAZOLE SODIUM 40 MG PO TBEC: 40.0000 mg | DELAYED_RELEASE_TABLET | Freq: Every day | ORAL | 1 refills | Status: DC

## 2020-05-06 MED ORDER — SUCRALFATE 1 G PO TABS: 1.0000 g | ORAL_TABLET | Freq: Three times a day (TID) | ORAL | 0 refills | Status: DC

## 2020-05-06 NOTE — ED Triage Notes (Signed)
C/O "burning/stinging" to esophagus and stomach every time he swallows food or liquid over past month, with progressive worsening.

## 2020-05-07 NOTE — ED Provider Notes (Signed)
[facility]    CSN: [id number] Arrival date & time: 05/06/20  1735      History   Chief Complaint Chief Complaint  Patient presents with  . Painful swallowing    HPI Roberto Fisher is a 50 y.o. male.   Patient presenting today with 1 to 2 months of worsening burning/stinging sensation down the esophagus and into the stomach with eating or drinking.  States initially it was only every now and again but now it is every time Roberto Fisher eats or drinks anything, including water.  Roberto Fisher states that the pain will last about an hour after consuming anything and then dissipate on its own.  Roberto Fisher has never had this happen to him in the past.  Roberto Fisher denies nausea, vomiting, hematemesis, melena, lower abdominal pain, history of cigarette smoking or drinking alcohol in excess, personal history or family history of GI cancers, unintentional weight loss, fevers, recent diet changes.  So far has not tried anything over-the-counter for symptoms as Roberto Fisher did not know to try.  Roberto Fisher does note that Roberto Fisher has a consultation scheduled for next week with GI to schedule a screening colonoscopy as Roberto Fisher has never had one before but otherwise does not regularly follow with GI for any reason.     History reviewed. No pertinent past medical history.  Patient Active Problem List   Diagnosis Date Noted  . Obsessive-compulsive disorder 04/09/2020  . Diarrhea 02/20/2020  . SOB (shortness of breath) 02/20/2020  . Elevated blood pressure reading in office without diagnosis of hypertension 02/20/2020  . History of second hand smoke exposure 02/20/2020  . Abscess of abdominal wall 07/09/2013  . Sepsis ([place]) 06/04/2013  . Cellulitis of left abdominal wall 06/03/2013    Past Surgical History:  Procedure Laterality Date  . INCISION AND DRAINAGE ABSCESS N/A 06/06/2013   Procedure: INCISION AND DRAINAGE DEBRIDEMENT OF ABDOMINAL WALL;  Surgeon: Imogene Burn. Georgette Dover, MD;  Location: [facility];  Service: General;  Laterality: N/A;        Home Medications    Prior to Admission medications   Medication Sig Start Date End Date Taking? Authorizing Provider  fluticasone (FLONASE) 50 MCG/ACT nasal spray Place 2 sprays into both nostrils daily.   Yes [provider]  Multiple Vitamin (MULTIVITAMIN) tablet Take 1 tablet by mouth daily.   Yes [provider]  pantoprazole (PROTONIX) 40 MG tablet Take 1 tablet (40 mg total) by mouth daily. 05/06/20  Yes Volney American, PA-C  sucralfate (CARAFATE) 1 g tablet Take 1 tablet (1 g total) by mouth 4 (four) times daily -  with meals and at bedtime. May dissolve tablet in glass of water and drink 05/06/20  Yes Volney American, PA-C  OVER THE COUNTER MEDICATION Take 1 tablet by mouth at bedtime as needed (for sleep). Medication : over the counter "calming vitamin" Patient not taking: No sig reported    [provider]    Family History Family History  Problem Relation Age of Onset  . Hypertension Mother   . Diabetes Mellitus II Father   . Dementia Father     Social History Social History   Tobacco Use  . Smoking status: Never Smoker  . Smokeless tobacco: Never Used  Vaping Use  . Vaping Use: Never used  Substance Use Topics  . Alcohol use: No  . Drug use: No     Allergies   Patient has no known allergies.   Review of Systems Review of Systems Per HPI Physical  Exam Triage Vital Signs ED Triage Vitals [05/06/20 1758]  Enc Vitals Group     BP 137/85     Pulse Rate 94     Resp 16     Temp 98.2 F (36.8 C)     Temp Source Oral     SpO2 97 %     Weight      Height      Head Circumference      Peak Flow      Pain Score 0     Pain Loc      Pain Edu?      Excl. in [place]?    No data found.  Updated Vital Signs BP 137/85   Pulse 94   Temp 98.2 F (36.8 C) (Oral)   Resp 16   SpO2 97%   Visual Acuity Right Eye Distance:   Left Eye Distance:   Bilateral Distance:    Right Eye Near:   Left Eye Near:     Bilateral Near:     Physical Exam Vitals and nursing note reviewed.  Constitutional:      Appearance: Normal appearance.  HENT:     Head: Atraumatic.  Eyes:     Extraocular Movements: Extraocular movements intact.     Conjunctiva/sclera: Conjunctivae normal.  Cardiovascular:     Rate and Rhythm: Normal rate and regular rhythm.  Pulmonary:     Effort: Pulmonary effort is normal. No respiratory distress.     Breath sounds: Normal breath sounds. No wheezing or rales.  Abdominal:     General: Bowel sounds are normal. There is no distension.     Palpations: Abdomen is soft. There is no mass.     Tenderness: There is abdominal tenderness (mild epigastric ttp). There is no right CVA tenderness, left CVA tenderness, guarding or rebound.     Hernia: No hernia is present.  Musculoskeletal:        General: Normal range of motion.     Cervical back: Normal range of motion and neck supple.  Skin:    General: Skin is warm and dry.  Neurological:     General: No focal deficit present.     Mental Status: Roberto Fisher is oriented to person, place, and time.  Psychiatric:        Mood and Affect: Mood normal.        Thought Content: Thought content normal.        Judgment: Judgment normal.      UC Treatments / Results  Labs (all labs ordered are listed, but only abnormal results are displayed) Labs Reviewed - No data to display  EKG   Radiology No results found.  Procedures Procedures (including critical care time)  Medications Ordered in UC Medications - No data to display  Initial Impression / Assessment and Plan / UC Course  I have reviewed the triage vital signs and the nursing notes.  Pertinent labs & imaging results that were available during my care of the patient were reviewed by me and considered in my medical decision making (see chart for details).     Suspect gastritis/esophagitis to be because of his current symptoms.  Will trial Protonix and Carafate in addition to  diet modifications for next week or so and have him discuss with GI and his upcoming consultation that was already scheduled for screening colonoscopy.  Return precautions given for acutely worsening symptoms in meantime.  Final Clinical Impressions(s) / UC Diagnoses   Final diagnoses:  Abdominal pain, epigastric  Discharge Instructions   None    ED Prescriptions    Medication Sig Dispense Auth. Provider   pantoprazole (PROTONIX) 40 MG tablet Take 1 tablet (40 mg total) by mouth daily. 30 tablet Volney American, [place]   sucralfate (CARAFATE) 1 g tablet Take 1 tablet (1 g total) by mouth 4 (four) times daily -  with meals and at bedtime. May dissolve tablet in glass of water and drink 120 tablet Volney American, [place]     PDMP not reviewed this encounter.   Volney American, [place] 05/07/20 [phone]

## 2020-05-12 ENCOUNTER — Other Ambulatory Visit: Payer: Self-pay

## 2020-05-12 ENCOUNTER — Encounter: Payer: Self-pay | Admitting: Physician Assistant

## 2020-05-12 ENCOUNTER — Ambulatory Visit (INDEPENDENT_AMBULATORY_CARE_PROVIDER_SITE_OTHER): Payer: 59 | Admitting: Physician Assistant

## 2020-05-12 VITALS — BP 110/74 | HR 76 | Ht 63.75 in | Wt 168.2 lb

## 2020-05-12 DIAGNOSIS — Z1211 Encounter for screening for malignant neoplasm of colon: Secondary | ICD-10-CM | POA: Diagnosis not present

## 2020-05-12 DIAGNOSIS — R1310 Dysphagia, unspecified: Secondary | ICD-10-CM

## 2020-05-12 MED ORDER — PANTOPRAZOLE SODIUM 40 MG PO TBEC: 40.0000 mg | DELAYED_RELEASE_TABLET | Freq: Every day | ORAL | 6 refills | Status: DC

## 2020-05-12 NOTE — Patient Instructions (Signed)
You have been scheduled for an endoscopy and colonoscopy. Please follow the written instructions given to you at your visit today. Please pick up your prep supplies at the pharmacy within the next 1-3 days. If you use inhalers (even only as needed), please bring them with you on the day of your procedure.  Due to recent changes in healthcare laws, you may see the results of your imaging and laboratory studies on MyChart before your provider has had a chance to review them.  We understand that in some cases there may be results that are confusing or concerning to you. Not all laboratory results come back in the same time frame and the provider may be waiting for multiple results in order to interpret others.  Please give [place] 48 hours in order for your provider to thoroughly review all the results before contacting the office for clarification of your results.   We have sent the following medications to your pharmacy for you to pick up at your convenience: Protonix   Finish your carafate-make it into a liquid slurry by mixing it with 10cc of water. Take between meals and at bedtime.  I appreciate the opportunity to care for you. Amy Esterwood, PA-C

## 2020-05-12 NOTE — Progress Notes (Signed)
Subjective:    Patient ID: Roberto Fisher, male    DOB: 09-10-1970, 50 y.o.   MRN: [id number]  HPI Roberto Fisher is a 50 year old white male, new to GI today, referred by Carrolyn Meiers PA-C/[facility] for screening colonoscopy.  Patient has not had any prior GI evaluation. He denies any lower GI complaints, says he has had some chronic issues with alternating bowel habits but has not noted any melena or hematochezia and has not noted any recent changes in his bowel habits.  He denies any abdominal pain.  Family history is negative for colon cancer and polyps as far as he is aware. Patient did have a recent ER visit on 05/06/2020 with complaints of dysphagia and odynophagia.  He says he had not had any prior esophageal issues but over the past couple of months had noticed some gradually progressive odynophagia.  He describes a burning stinging sensation with swallowing food and liquids.  He does have occasional what he would call heartburn or indigestion.  Appetite has been fine, weight has been stable.  He describes discomfort with swallowing which she can feel from his mid chest into the epigastrium.  He was started on Protonix 40 mg p.o. every morning and was given Carafate tablets 1 g 4 times daily.  He has been taking both of these medications over the past few days and thinks maybe has noticed a little bit of improvement. No regular EtOH use, no regular aspirin or NSAIDs.  Non-smoker.  Review of Systems Pertinent positive and negative review of systems were noted in the above HPI section.  All other review of systems was otherwise negative.  Outpatient Encounter Medications as of 05/12/2020  Medication Sig  . fluticasone (FLONASE) 50 MCG/ACT nasal spray Place 2 sprays into both nostrils daily.  . Multiple Vitamin (MULTIVITAMIN) tablet Take 1 tablet by mouth daily.  . sucralfate (CARAFATE) 1 g tablet Take 1 tablet (1 g total) by mouth 4 (four) times daily -  with  meals and at bedtime. May dissolve tablet in glass of water and drink  . [DISCONTINUED] pantoprazole (PROTONIX) 40 MG tablet Take 1 tablet (40 mg total) by mouth daily.  . pantoprazole (PROTONIX) 40 MG tablet Take 1 tablet (40 mg total) by mouth daily before breakfast.  . [DISCONTINUED] OVER THE COUNTER MEDICATION Take 1 tablet by mouth at bedtime as needed (for sleep). Medication : over the counter "calming vitamin" (Patient not taking: No sig reported)   No facility-administered encounter medications on file as of 05/12/2020.   No Known Allergies Patient Active Problem List   Diagnosis Date Noted  . Obsessive-compulsive disorder 04/09/2020  . Diarrhea 02/20/2020  . SOB (shortness of breath) 02/20/2020  . Elevated blood pressure reading in office without diagnosis of hypertension 02/20/2020  . History of second hand smoke exposure 02/20/2020  . Abscess of abdominal wall 07/09/2013  . Sepsis ([place]) 06/04/2013  . Cellulitis of left abdominal wall 06/03/2013   Social History   Socioeconomic History  . Marital status: Single    Spouse name: Not on file  . Number of children: 0  . Years of education: Not on file  . Highest education level: Not on file  Occupational History  . Occupation: landscaping/lawn care  Tobacco Use  . Smoking status: Never Smoker  . Smokeless tobacco: Never Used  Vaping Use  . Vaping Use: Never used  Substance and Sexual Activity  . Alcohol use: Yes    Comment: occasional  .  Drug use: No  . Sexual activity: Not on file  Other Topics Concern  . Not on file  Social History Narrative  . Not on file   Social Determinants of Health   Financial Resource Strain: Not on file  Food Insecurity: Not on file  Transportation Needs: Not on file  Physical Activity: Not on file  Stress: Not on file  Social Connections: Not on file  Intimate Partner Violence: Not on file    Mr. Pidcock family history includes Dementia in his father; Diabetes in his mother;  Diabetes Mellitus II in his father; Heart disease in his father; Hyperlipidemia in his father; Hypertension in his father, maternal grandfather, maternal grandmother, and mother.      Objective:    Vitals:   05/12/20 1412  BP: 110/74  Pulse: 76    Physical Exam.Well-developed well-nourished WM in no acute distress.  Height, Weight,168  BMI 29  HEENT; nontraumatic normocephalic, EOMI, PE R LA, sclera anicteric. Oropharynx; not examined today Neck; supple, no JVD Cardiovascular; regular rate and rhythm with S1-S2, no murmur rub or gallop Pulmonary; Clear bilaterally Abdomen; soft, minimally tender high in the epigastrium, nondistended, no palpable mass or hepatosplenomegaly, bowel sounds are active Rectal; not done Skin; benign exam, no jaundice rash or appreciable lesions Extremities; no clubbing cyanosis or edema skin warm and dry Neuro/Psych; alert and oriented x4, grossly nonfocal mood and affect appropriate       Assessment & Plan:   #67 50 year old white male referred for colon cancer screening, asymptomatic and average risk  # 2   76-month history of odynophagia, no dysphagia.  Symptoms consistent with esophagitis, suspect reflux induced, cannot rule out neoplasm, eosinophilic esophagitis or infectious etiology  #3 history of OCD  Plan; Patient will continue Protonix 40 mg p.o. every morning, refill sent Finish current prescription of Carafate but asked patient to mix this into a slurry with 10 cc of water and drink between meals and at bedtime.  He will be scheduled for colonoscopy and EGD with Dr. Lyndel Safe .Both procedures were discussed in detail with the patient including indications risks and benefits and he is agreeable to proceed. Further recommendations pending findings at endoscopic evaluation.  Amy S Esterwood PA-C 05/12/2020   Cc: Mayers, Loraine Grip, PA-C

## 2020-05-19 ENCOUNTER — Encounter: Payer: 59 | Admitting: Internal Medicine

## 2020-06-16 ENCOUNTER — Encounter: Payer: Self-pay | Admitting: Gastroenterology

## 2020-06-16 ENCOUNTER — Other Ambulatory Visit: Payer: Self-pay

## 2020-06-16 ENCOUNTER — Ambulatory Visit (AMBULATORY_SURGERY_CENTER): Payer: 59 | Admitting: Gastroenterology

## 2020-06-16 VITALS — BP 102/63 | HR 82 | Temp 97.8°F | Resp 16 | Ht 63.0 in | Wt 168.0 lb

## 2020-06-16 DIAGNOSIS — K648 Other hemorrhoids: Secondary | ICD-10-CM

## 2020-06-16 DIAGNOSIS — R197 Diarrhea, unspecified: Secondary | ICD-10-CM | POA: Diagnosis present

## 2020-06-16 DIAGNOSIS — K5730 Diverticulosis of large intestine without perforation or abscess without bleeding: Secondary | ICD-10-CM | POA: Diagnosis not present

## 2020-06-16 DIAGNOSIS — K209 Esophagitis, unspecified without bleeding: Secondary | ICD-10-CM

## 2020-06-16 DIAGNOSIS — K2100 Gastro-esophageal reflux disease with esophagitis, without bleeding: Secondary | ICD-10-CM | POA: Diagnosis not present

## 2020-06-16 DIAGNOSIS — K2270 Barrett's esophagus without dysplasia: Secondary | ICD-10-CM | POA: Diagnosis not present

## 2020-06-16 DIAGNOSIS — K2970 Gastritis, unspecified, without bleeding: Secondary | ICD-10-CM

## 2020-06-16 DIAGNOSIS — R1310 Dysphagia, unspecified: Secondary | ICD-10-CM | POA: Diagnosis not present

## 2020-06-16 DIAGNOSIS — Z1211 Encounter for screening for malignant neoplasm of colon: Secondary | ICD-10-CM

## 2020-06-16 MED ORDER — SUCRALFATE 1 G PO TABS: ORAL_TABLET | ORAL | 1 refills | Status: DC

## 2020-06-16 MED ORDER — PANTOPRAZOLE SODIUM 40 MG PO TBEC: DELAYED_RELEASE_TABLET | ORAL | 6 refills | Status: DC

## 2020-06-16 NOTE — Patient Instructions (Signed)
Discharge instructions given. Handouts on Diverticulosis,Hemorrhoids,Esophagitis,Hiatal Hernia,Gerd. Prescription sent to pharmacy. Resume previous medications. FU in GI clinic with Amy in 12 weeks. Office will call to schedule. YOU HAD AN ENDOSCOPIC PROCEDURE TODAY AT [facility]:   Refer to the procedure report that was given to you for any specific questions about what was found during the examination.  If the procedure report does not answer your questions, please call your gastroenterologist to clarify.  If you requested that your care partner not be given the details of your procedure findings, then the procedure report has been included in a sealed envelope for you to review at your convenience later.  YOU SHOULD EXPECT: Some feelings of bloating in the abdomen. Passage of more gas than usual.  Walking can help get rid of the air that was put into your GI tract during the procedure and reduce the bloating. If you had a lower endoscopy (such as a colonoscopy or flexible sigmoidoscopy) you may notice spotting of blood in your stool or on the toilet paper. If you underwent a bowel prep for your procedure, you may not have a normal bowel movement for a few days.  Please Note:  You might notice some irritation and congestion in your nose or some drainage.  This is from the oxygen used during your procedure.  There is no need for concern and it should clear up in a day or so.  SYMPTOMS TO REPORT IMMEDIATELY:   Following lower endoscopy (colonoscopy or flexible sigmoidoscopy):  Excessive amounts of blood in the stool  Significant tenderness or worsening of abdominal pains  Swelling of the abdomen that is new, acute  Fever of 100F or higher   Following upper endoscopy (EGD)  Vomiting of blood or coffee ground material  New chest pain or pain under the shoulder blades  Painful or persistently difficult swallowing  New shortness of breath  Fever of 100F or higher  Black,  tarry-looking stools  For urgent or emergent issues, a gastroenterologist can be reached at any hour by calling [phone]. Do not use MyChart messaging for urgent concerns.    DIET:  We do recommend a small meal at first, but then you may proceed to your regular diet.  Drink plenty of fluids but you should avoid alcoholic beverages for 24 hours.  ACTIVITY:  You should plan to take it easy for the rest of today and you should NOT DRIVE or use heavy machinery until tomorrow (because of the sedation medicines used during the test).    FOLLOW UP: Our staff will call the number listed on your records 48-72 hours following your procedure to check on you and address any questions or concerns that you may have regarding the information given to you following your procedure. If we do not reach you, we will leave a message.  We will attempt to reach you two times.  During this call, we will ask if you have developed any symptoms of COVID 19. If you develop any symptoms (ie: fever, flu-like symptoms, shortness of breath, cough etc.) before then, please call [phone].  If you test positive for Covid 19 in the 2 weeks post procedure, please call and report this information to [place].    If any biopsies were taken you will be contacted by phone or by letter within the next 1-3 weeks.  Please call us at [phone] if you have not heard about the biopsies in 3 weeks.    SIGNATURES/CONFIDENTIALITY: You and/or  your care partner have signed paperwork which will be entered into your electronic medical record.  These signatures attest to the fact that that the information above on your After Visit Summary has been reviewed and is understood.  Full responsibility of the confidentiality of this discharge information lies with you and/or your care-partner.

## 2020-06-16 NOTE — Progress Notes (Signed)
Report to PACU, RN, vss, BBS= Clear.  

## 2020-06-16 NOTE — Op Note (Signed)
[place] Patient Name: Roberto Fisher Procedure Date: 06/16/2020 3:19 PM MRN: [id number] Endoscopist: Jackquline Denmark , MD Age: 50 Referring MD:  Date of Birth: 07-Apr-1970 Gender: Male Account #: [id number] Procedure:                Colonoscopy Indications:              Screening for colorectal malignant neoplasm. H/O                            intermittent diarrhea. Medicines:                Monitored Anesthesia Care Procedure:                Pre-Anesthesia Assessment:                           - Prior to the procedure, a History and Physical                            was performed, and patient medications and                            allergies were reviewed. The patient's tolerance of                            previous anesthesia was also reviewed. The risks                            and benefits of the procedure and the sedation                            options and risks were discussed with the patient.                            All questions were answered, and informed consent                            was obtained. Prior Anticoagulants: The patient has                            taken no previous anticoagulant or antiplatelet                            agents. ASA Grade Assessment: II - A patient with                            mild systemic disease. After reviewing the risks                            and benefits, the patient was deemed in                            satisfactory condition to undergo the procedure.  After obtaining informed consent, the colonoscope                            was passed under direct vision. Throughout the                            procedure, the patient's blood pressure, pulse, and                            oxygen saturations were monitored continuously. The                            Olympus CF-HQ190 [id number]) Colonoscope was                            introduced through the anus and advanced to  the the                            cecum, identified by appendiceal orifice and                            ileocecal valve. The colonoscopy was performed                            without difficulty. The patient tolerated the                            procedure well. The quality of the bowel                            preparation was fair. There was some retained stool                            in some areas of the colon which were harder to                            aspirate due to retained solid vegetable material.                            Aggressive suctioning and aspiration was performed.                            Overall examination was adequate. The ileocecal                            valve, appendiceal orifice, and rectum were                            photographed. Scope In: 3:51:45 PM Scope Out: 4:06:13 PM Scope Withdrawal Time: 0 hours 10 minutes 20 seconds  Total Procedure Duration: 0 hours 14 minutes 28 seconds  Findings:                 A few small-mouthed diverticula were found in the  sigmoid colon.                           Non-bleeding internal hemorrhoids were found during                            retroflexion. The hemorrhoids were moderate.                           The exam was otherwise without abnormality on                            direct and retroflexion views.                           The colon (entire examined portion) appeared normal                            with well preserved vascular pattern. Biopsies were                            taken with a cold forceps for histology. Complications:            No immediate complications. Estimated Blood Loss:     Estimated blood loss: none. Estimated blood loss:                            none. Impression:               - Mild sigmoid diverticulosis                           - Non-bleeding internal hemorrhoids.                           - The examination was otherwise normal on  direct                            and retroflexion views.                           - No specimens collected. Recommendation:           - Patient has a contact number available for                            emergencies. The signs and symptoms of potential                            delayed complications were discussed with the                            patient. Return to normal activities tomorrow.                            Written discharge instructions were provided to the  patient.                           - Patient has a contact number available for                            emergencies. The signs and symptoms of potential                            delayed complications were discussed with the                            patient. Return to normal activities tomorrow.                            Written discharge instructions were provided to the                            patient.                           - Resume previous diet.                           - Continue present medications.                           - Await pathology results.                           - Repeat colonoscopy in 10 years for screening                            purposes. Earlier, if with any new problems or                            change in family history. Jackquline Denmark, MD 06/16/2020 4:13:26 PM This report has been signed electronically.

## 2020-06-16 NOTE — Progress Notes (Signed)
Called to room to assist during endoscopic procedure.  Patient ID and intended procedure confirmed with present staff. Received instructions for my participation in the procedure from the performing physician.  

## 2020-06-16 NOTE — Op Note (Signed)
[place] Patient Name: Roberto Fisher Procedure Date: 06/16/2020 3:19 PM MRN: [id number] Endoscopist: Jackquline Denmark , MD Age: 50 Referring MD:  Date of Birth: 12/06/1970 Gender: Male Account #: [id number] Procedure:                Upper GI endoscopy Indications:              Odynophagia, GERD Medicines:                Monitored Anesthesia Care Procedure:                Pre-Anesthesia Assessment:                           - Prior to the procedure, a History and Physical                            was performed, and patient medications and                            allergies were reviewed. The patient's tolerance of                            previous anesthesia was also reviewed. The risks                            and benefits of the procedure and the sedation                            options and risks were discussed with the patient.                            All questions were answered, and informed consent                            was obtained. Prior Anticoagulants: The patient has                            taken no previous anticoagulant or antiplatelet                            agents. ASA Grade Assessment: II - A patient with                            mild systemic disease. After reviewing the risks                            and benefits, the patient was deemed in                            satisfactory condition to undergo the procedure.                           After obtaining informed consent, the endoscope was  passed under direct vision. Throughout the                            procedure, the patient's blood pressure, pulse, and                            oxygen saturations were monitored continuously. The                            Endoscope was introduced through the mouth, and                            advanced to the second part of duodenum. The upper                            GI endoscopy was accomplished without  difficulty.                            The patient tolerated the procedure well. Scope In: Scope Out: Findings:                 There were esophageal mucosal changes consistent                            with long-segment Barrett's esophagus (6 cm)                            present in the lower half of the esophagus,                            extending from 30 cm (most proximal margin) up to                            36 cm (GE Jn). Few erosions extended to 28 cm.                            Mucosa was biopsied with a cold forceps for                            histology every 2 cm in all 4 quadrants, directed                            by [facility]. A total of 4 specimen bottles were sent to                            pathology. No strictures or masses were noted.                            Patient had significant free reflux.                           A 4 cm hiatal hernia was present.  Localized minimal inflammation characterized by                            erythema was found in the gastric antrum. Biopsies                            were taken with a cold forceps for histology.                           The examined duodenum was normal. Biopsies for                            histology were taken with a cold forceps for                            evaluation of celiac disease. Complications:            No immediate complications. Estimated Blood Loss:     Estimated blood loss: none. Impression:               - Esophageal mucosal changes consistent with                            long-segment Barrett's esophagus. Biopsied.                           - Erosive esophagitis                           - 4 cm hiatal hernia.                           - Gastritis. Biopsied. Recommendation:           - Patient has a contact number available for                            emergencies. The signs and symptoms of potential                            delayed complications were  discussed with the                            patient. Return to normal activities tomorrow.                            Written discharge instructions were provided to the                            patient.                           - Resume previous diet.                           - Nonpharmacologic means of reflux control.                           -  Avoid nonsteroidals.                           - Follow biopsies                           - Increase Protonix (pantoprazole) 40 mg PO BID for                            12 weeks (#60, 6 refills), then once a day                            indefinitely.                           - Use sucralfate tablets 1 gram PO QID for 2 weeks                            #60, 1 refill.                           - The findings and recommendations were discussed                            with the patient.                           - The findings and recommendations were discussed                            with the patient's family.                           - FU in GI clinic with Amy in 12 weeks Jackquline Denmark, MD 06/16/2020 4:23:38 PM This report has been signed electronically.

## 2020-06-20 ENCOUNTER — Telehealth: Payer: Self-pay | Admitting: *Deleted

## 2020-06-20 NOTE — Telephone Encounter (Signed)
  Follow up Call-  Call back number 06/16/2020  Post procedure Call Back phone  # [phone]  Permission to leave phone message Yes  Some recent data might be hidden     Patient questions:  Do you have a fever, pain , or abdominal swelling? No. Pain Score  0 *  Have you tolerated food without any problems? Yes.    Have you been able to return to your normal activities? Yes.    Do you have any questions about your discharge instructions: Diet   No. Medications  No. Follow up visit  No.  Do you have questions or concerns about your Care? No.  Actions: * If pain score is 4 or above: No action needed, pain <4.  1. Have you developed a fever since your procedure? no  2.   Have you had an respiratory symptoms (SOB or cough) since your procedure? no  3.   Have you tested positive for COVID 19 since your procedure no  4.   Have you had any family members/close contacts diagnosed with the COVID 19 since your procedure?  no   If yes to any of these questions please route to Joylene John, RN and Joella Prince, RN

## 2020-06-30 ENCOUNTER — Encounter: Payer: 59 | Admitting: Gastroenterology

## 2020-07-06 ENCOUNTER — Encounter: Payer: Self-pay | Admitting: Gastroenterology

## 2020-07-09 ENCOUNTER — Other Ambulatory Visit: Payer: Self-pay

## 2020-07-09 ENCOUNTER — Encounter (HOSPITAL_COMMUNITY): Payer: Self-pay | Admitting: *Deleted

## 2020-07-09 ENCOUNTER — Ambulatory Visit (HOSPITAL_COMMUNITY)
Admission: EM | Admit: 2020-07-09 | Discharge: 2020-07-09 | Disposition: A | Discharge status: Home / Self Care | Payer: 59 | Attending: Physician Assistant | Admitting: Physician Assistant

## 2020-07-09 DIAGNOSIS — J019 Acute sinusitis, unspecified: Secondary | ICD-10-CM

## 2020-07-09 HISTORY — DX: Other allergy status, other than to drugs and biological substances: Z91.09

## 2020-07-09 MED ORDER — AMOXICILLIN-POT CLAVULANATE 875-125 MG PO TABS: 1.0000 | ORAL_TABLET | Freq: Two times a day (BID) | ORAL | 0 refills | Status: AC

## 2020-07-09 NOTE — Discharge Instructions (Addendum)
Take antibiotic as prescribed Recommend daily allergy medication like Zyrtec or Claritin Recommend daily use of Flonase

## 2020-07-09 NOTE — ED Triage Notes (Signed)
Pt reports sinus congestion .

## 2020-07-21 ENCOUNTER — Other Ambulatory Visit: Payer: Self-pay | Admitting: Gastroenterology

## 2020-08-09 ENCOUNTER — Encounter (INDEPENDENT_AMBULATORY_CARE_PROVIDER_SITE_OTHER): Payer: Self-pay

## 2020-09-11 ENCOUNTER — Encounter (HOSPITAL_COMMUNITY): Payer: Self-pay

## 2020-09-11 ENCOUNTER — Other Ambulatory Visit: Payer: Self-pay

## 2020-09-11 ENCOUNTER — Ambulatory Visit (HOSPITAL_COMMUNITY)
Admission: EM | Admit: 2020-09-11 | Discharge: 2020-09-11 | Disposition: A | Discharge status: Home / Self Care | Payer: 59 | Attending: Urgent Care | Admitting: Urgent Care

## 2020-09-11 DIAGNOSIS — R0789 Other chest pain: Secondary | ICD-10-CM | POA: Diagnosis not present

## 2020-09-11 DIAGNOSIS — R0602 Shortness of breath: Secondary | ICD-10-CM | POA: Diagnosis not present

## 2020-09-11 NOTE — Discharge Instructions (Addendum)
You have declined transport by ambulance. Please report to the hospital now to make sure you are evaluated for a heart event. Otherwise, keep your appointment with your regular doctor and see if they would like to refer you to a cardiologist, a heart doctor.

## 2020-09-11 NOTE — ED Provider Notes (Signed)
[place]   MRN: [id number] DOB: Jul 08, 1970  Subjective:   Roberto Fisher is a 50 y.o. male presenting for acute onset of persistent midsternal chest pain that is chest pressure type sensation since 1 AM this morning.  He states he also felt short of breath at the time, has ongoing chest pain currently but is mild.  Initially was a 6 out of 10.  Had associated dizziness when he got up out of bed.  Denies fever, headache, confusion, neck/jaw/left arm pain.  No radiation of the chest pain.  No nausea, vomiting, diaphoresis.  He is not a smoker.  No history of diabetes, high blood pressure, cholesterol.  Family history is positive for the aforementioned as seen below.  No current facility-administered medications for this encounter.  Current Outpatient Medications:    Multiple Vitamin (MULTIVITAMIN) tablet, Take 1 tablet by mouth daily., Disp: , Rfl:    No Known Allergies  Past Medical History:  Diagnosis Date   Pollen allergies      Past Surgical History:  Procedure Laterality Date   INCISION AND DRAINAGE ABSCESS N/A 06/06/2013   Procedure: INCISION AND DRAINAGE DEBRIDEMENT OF ABDOMINAL WALL;  Surgeon: Imogene Burn. Tsuei, MD;  Location: [facility] OR;  Service: General;  Laterality: N/A;    Family History  Problem Relation Age of Onset   Hypertension Mother    Diabetes Mother    Diabetes Mellitus II Father    Dementia Father    Heart disease Father    Hypertension Father    Hyperlipidemia Father    Hypertension Maternal Grandmother    Hypertension Maternal Grandfather     Social History   Tobacco Use   Smoking status: Never   Smokeless tobacco: Never  Vaping Use   Vaping Use: Never used  Substance Use Topics   Alcohol use: Yes    Comment: occasional   Drug use: No    ROS   Objective:   Vitals: BP (!) 141/95 (BP Location: Left Arm)   Pulse 62   Temp 98.4 F (36.9 C) (Oral)   Resp 18   SpO2 95%   Physical Exam Constitutional:       General: He is not in acute distress.    Appearance: Normal appearance. He is well-developed and normal weight. He is not ill-appearing, toxic-appearing or diaphoretic.  HENT:     Head: Normocephalic and atraumatic.     Right Ear: External ear normal.     Left Ear: External ear normal.     Nose: Nose normal.     Mouth/Throat:     Pharynx: Oropharynx is clear.  Eyes:     General: No scleral icterus.       Right eye: No discharge.        Left eye: No discharge.     Extraocular Movements: Extraocular movements intact.     Pupils: Pupils are equal, round, and reactive to light.  Cardiovascular:     Rate and Rhythm: Normal rate and regular rhythm.     Heart sounds: No murmur heard.   No friction rub. No gallop.  Pulmonary:     Effort: Pulmonary effort is normal. No respiratory distress.     Breath sounds: No wheezing or rales.  Abdominal:     General: Bowel sounds are normal. There is no distension.     Palpations: Abdomen is soft. There is no mass.     Tenderness: There is no abdominal tenderness. There is no guarding or rebound.  Musculoskeletal:     Cervical back: Normal range of motion.  Skin:    General: Skin is warm and dry.  Neurological:     Mental Status: He is alert and oriented to person, place, and time.  Psychiatric:        Mood and Affect: Mood normal.        Behavior: Behavior normal.        Thought Content: Thought content normal.        Judgment: Judgment normal.    ED ECG REPORT   Date: 09/11/2020  Rate: 68bpm  Rhythm: normal sinus rhythm  QRS Axis: normal  Intervals: normal  ST/T Wave abnormalities: normal  Conduction Disutrbances:none  Narrative Interpretation:   Old EKG Reviewed: unchanged  I have personally reviewed the EKG tracing and agree with the computerized printout as noted.   Assessment and Plan :   PDMP not reviewed this encounter.  1. Atypical chest pain   2. Shortness of breath     Recommended further evaluation in the  emergency room as patient has significant concerns about his heart. EKG shows sinus rhythm but he does describe symptoms warranting cardiac evaluation now. Declined transport to the hospital by EMS due to cost burden. Contracts for safety and will report to the ER now.    Jaynee Eagles, PA-C 09/11/20 1513

## 2020-09-11 NOTE — ED Triage Notes (Signed)
Pt presents with chest pain and shortness of breaths since 1-2 am today; diarrhea last night. States he has "nerve problem" and maybe that's why he has the chest pain.

## 2020-09-11 NOTE — ED Triage Notes (Signed)
CALLED x2 no answer

## 2020-09-26 ENCOUNTER — Other Ambulatory Visit: Payer: Self-pay

## 2020-09-26 ENCOUNTER — Encounter: Payer: Self-pay | Admitting: Critical Care Medicine

## 2020-09-26 ENCOUNTER — Ambulatory Visit: Payer: 59 | Attending: Critical Care Medicine | Admitting: Critical Care Medicine

## 2020-09-26 VITALS — BP 133/78 | HR 75 | Ht 63.0 in | Wt 165.0 lb

## 2020-09-26 DIAGNOSIS — Z139 Encounter for screening, unspecified: Secondary | ICD-10-CM

## 2020-09-26 DIAGNOSIS — R0789 Other chest pain: Secondary | ICD-10-CM

## 2020-09-26 DIAGNOSIS — Z87442 Personal history of urinary calculi: Secondary | ICD-10-CM

## 2020-09-26 DIAGNOSIS — K2270 Barrett's esophagus without dysplasia: Secondary | ICD-10-CM | POA: Insufficient documentation

## 2020-09-26 DIAGNOSIS — K22710 Barrett's esophagus with low grade dysplasia: Secondary | ICD-10-CM | POA: Diagnosis not present

## 2020-09-26 DIAGNOSIS — F909 Attention-deficit hyperactivity disorder, unspecified type: Secondary | ICD-10-CM | POA: Insufficient documentation

## 2020-09-26 MED ORDER — PANTOPRAZOLE SODIUM 40 MG PO TBEC: 40.0000 mg | DELAYED_RELEASE_TABLET | Freq: Every day | ORAL | 3 refills | Status: DC

## 2020-09-26 NOTE — Patient Instructions (Addendum)
A urinalysis will be obtained today and also metabolic profile and hemoglobin A1c from the lab draw  Stay on the pantoprazole daily refills were sent to your [facility] pharmacy  Follow a healthy diet that reduces acid levels in the stomach see attachment  Keep follow-ups with gastroenterology  Return to see Dr. Joya Gaskins 4 months

## 2020-09-26 NOTE — Assessment & Plan Note (Signed)
Diagnosed on endoscopy in feb 2022 due to progressive esophagitis and dysphagia. Pt is not currently taking any antacids or PPIs, will resume and encouraged patient to follow up with GI. Also cautioned about regular NSAID use. Patient does not drink alcohol or use tobacco products.

## 2020-09-26 NOTE — Progress Notes (Addendum)
New Patient Office Visit  Subjective:  Patient ID: Roberto Fisher, male    DOB: 1970/08/08  Age: 50 y.o. MRN: [id number]  CC:  Chief Complaint  Patient presents with   New Patient (Initial Visit)    HPI Roberto Fisher presents for a new patient evaluation to establish primary care. He presents with a list of minor complaints to discuss such as hemorrhoids, tinnitus, headaches and a recent urgent care visit regarding substernal chest pains. Starting in mid 2021, he will experience a retrosternal chest "pressure" when he is at home. The pain occurs randomly and will last a few hours before resolving on its own without any no known alleviating factors. He has not tried any medication for the problem and in addition to chest pain he will sometimes also experience a headache. He denies trauma, syncope or vision changes. 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 He says the pain never bothers him at night nor while at work, only bothersome when he is at home. He denies fevers or recent illness, cough or shortness of breath. He does not drink alcohol, use tobacco products or any other illicit substances. On 09/11/20 he went to urgent care because of the pain and he was told to proceed to the ER for a further cardiac work-up but he declined due to cost.   In February 2022, he underwent a screening colonoscopy and diagnostic endoscopy due to progressive esophagitis and dysphagia. Work up shows some Barrett's esophagitis and a mild hiatal hernia. The patient often has heartburn and sleeps propped up in bed. Today he denies abd pain as well as NVD. He was taking an antacid but stopped. Colonoscopy was WNL- may repeat in 10 years.   He states his only past medical history is positive for ADHD. He does not take any medicine for this and states that his "nerves" often get to him. Patient does not describe himself as an anxious or ruminating person but appears  hypervigilant about his health and is quite talkative. He denies feeling depressed, thoughts of self harm nor hallucinations.  Additionally he mentions an odd complaint regarding to intermittent tinnitus. He says he regularly cleans his ears with Q tips and rinses with water. I cautioned him against using Q tips and recommended gentle flushing while in the shower.   He also mentioned he intermittently struggles with hemorrhoids. He has had them for years and will use OTC cream on an as needed basis.  He also has had a kidney stone and requests a urinalysis for his peace of mind. He denies any back pains or hematuria. No further workup was elaborated on these problems due to time constraints.   Past Medical History:  Diagnosis Date   Abscess of abdominal wall 07/09/2013   ADHD (attention deficit hyperactivity disorder)    Pollen allergies     Past Surgical History:  Procedure Laterality Date   INCISION AND DRAINAGE ABSCESS N/A 06/06/2013   Procedure: INCISION AND DRAINAGE DEBRIDEMENT OF ABDOMINAL WALL;  Surgeon: Imogene Burn. Georgette Dover, MD;  Location: [facility];  Service: General;  Laterality: N/A;    Family History  Problem Relation Age of Onset   Hypertension Mother    Diabetes Mother    Diabetes Mellitus II Father    Dementia Father    Heart disease Father    Hypertension Father    Hyperlipidemia Father    COPD Father    Hypertension Maternal Grandmother    Hypertension Maternal  Grandfather     Social History   Socioeconomic History   Marital status: Single    Spouse name: Not on file   Number of children: 0   Years of education: Not on file   Highest education level: Not on file  Occupational History   Occupation: landscaping/lawn care  Tobacco Use   Smoking status: Never   Smokeless tobacco: Never  Vaping Use   Vaping Use: Never used  Substance and Sexual Activity   Alcohol use: Yes    Comment: occasional   Drug use: No   Sexual activity: Not on file  Other Topics Concern    Not on file  Social History Narrative   Not on file   Social Determinants of Health   Financial Resource Strain: Not on file  Food Insecurity: Not on file  Transportation Needs: Not on file  Physical Activity: Not on file  Stress: Not on file  Social Connections: Not on file  Intimate Partner Violence: Not on file    ROS Review of Systems  Constitutional:  Negative for chills, diaphoresis, fatigue and fever.  HENT:  Positive for tinnitus. Negative for congestion, ear pain, hearing loss, sinus pressure, sinus pain, sneezing, sore throat and trouble swallowing.   Eyes: Negative.   Respiratory: Negative.    Cardiovascular:  Positive for chest pain (currently pain free). Negative for palpitations and leg swelling.  Gastrointestinal:  Negative for abdominal distention, abdominal pain, constipation, diarrhea, nausea and vomiting.  Genitourinary:  Negative for difficulty urinating, dysuria and hematuria.  Musculoskeletal: Negative.   Skin: Negative.   Psychiatric/Behavioral:  Negative for agitation, confusion, hallucinations and self-injury. The patient is hyperactive. The patient is not nervous/anxious.    Objective:   Today's Vitals: BP 133/78   Pulse 75   Ht 5\' 3"  (1.6 m)   Wt 165 lb (74.8 kg)   SpO2 97%   BMI 29.23 kg/m   Physical Exam Constitutional:      General: He is not in acute distress.    Appearance: Normal appearance.  HENT:     Head: Normocephalic and atraumatic.     Right Ear: Tympanic membrane, ear canal and external ear normal.     Left Ear: Tympanic membrane, ear canal and external ear normal.     Nose: Nose normal.     Mouth/Throat:     Mouth: Mucous membranes are moist.     Pharynx: Oropharynx is clear.  Eyes:     Pupils: Pupils are equal, round, and reactive to light.  Cardiovascular:     Rate and Rhythm: Normal rate and regular rhythm.     Pulses: Normal pulses.     Heart sounds: Normal heart sounds.  Pulmonary:     Effort: Pulmonary effort  is normal.     Breath sounds: Normal breath sounds.  Abdominal:     General: Bowel sounds are normal.     Palpations: Abdomen is soft.  Musculoskeletal:     Cervical back: Neck supple.  Skin:    General: Skin is warm and dry.  Neurological:     General: No focal deficit present.     Mental Status: He is alert.  Psychiatric:        Mood and Affect: Mood normal.        Behavior: Behavior normal.    Assessment & Plan:   Problem List Items Addressed This Visit       Digestive   Barrett esophagus    Diagnosed on endoscopy in feb 2022  due to progressive esophagitis and dysphagia. Pt is not currently taking any antacids or PPIs, will resume and encouraged patient to follow up with GI. Also cautioned about regular NSAID use. Patient does not drink alcohol or use tobacco products.          Other   Non-cardiac chest pain    Substernal chest pain seems to be related to GERD, esophagitis and hiatal hernia than cardiac etiology. He will resume antacid and follow up with his GI. Instructed patient to call the office if his pain becomes more constant or occurs with exertion.        History of kidney stones - Primary    He has a history of kidney stones and requests a urinalysis for peace of mind. Currently asymptomatic.        Relevant Orders   Urinalysis   Other Visit Diagnoses     Encounter for health-related screening       Relevant Orders   Hemoglobin Q8G   Basic Metabolic Panel       Outpatient Encounter Medications as of 09/26/2020  Medication Sig   Multiple Vitamin (MULTIVITAMIN) tablet Take 1 tablet by mouth daily.   pantoprazole (PROTONIX) 40 MG tablet Take 1 tablet (40 mg total) by mouth daily before breakfast.   [DISCONTINUED] pantoprazole (PROTONIX) 40 MG tablet Take by mouth.   No facility-administered encounter medications on file as of 09/26/2020.   46 min spent in chart review/ history/physical exam, complex decision making , patient education and answering  his questions  Follow-up: Return in about 4 months (around 01/27/2021).   Asencion Noble, MD

## 2020-09-26 NOTE — Assessment & Plan Note (Signed)
He has a history of kidney stones and requests a urinalysis for peace of mind. Currently asymptomatic.

## 2020-09-26 NOTE — Progress Notes (Deleted)
   New Patient Office Visit  Subjective:  Patient ID: Roberto Fisher, male    DOB: 1970/09/14  Age: 50 y.o. MRN: [id number]  CC: No chief complaint on file.   HPI Roberto Fisher presents for ***  Past Medical History:  Diagnosis Date   Pollen allergies     Past Surgical History:  Procedure Laterality Date   INCISION AND DRAINAGE ABSCESS N/A 06/06/2013   Procedure: INCISION AND DRAINAGE DEBRIDEMENT OF ABDOMINAL WALL;  Surgeon: Imogene Burn. Georgette Dover, MD;  Location: [facility];  Service: General;  Laterality: N/A;    Family History  Problem Relation Age of Onset   Hypertension Mother    Diabetes Mother    Diabetes Mellitus II Father    Dementia Father    Heart disease Father    Hypertension Father    Hyperlipidemia Father    Hypertension Maternal Grandmother    Hypertension Maternal Grandfather     Social History   Socioeconomic History   Marital status: Single    Spouse name: Not on file   Number of children: 0   Years of education: Not on file   Highest education level: Not on file  Occupational History   Occupation: landscaping/lawn care  Tobacco Use   Smoking status: Never   Smokeless tobacco: Never  Vaping Use   Vaping Use: Never used  Substance and Sexual Activity   Alcohol use: Yes    Comment: occasional   Drug use: No   Sexual activity: Not on file  Other Topics Concern   Not on file  Social History Narrative   Not on file   Social Determinants of Health   Financial Resource Strain: Not on file  Food Insecurity: Not on file  Transportation Needs: Not on file  Physical Activity: Not on file  Stress: Not on file  Social Connections: Not on file  Intimate Partner Violence: Not on file    ROS Review of Systems  Objective:   Today's Vitals: There were no vitals taken for this visit.  Physical Exam  Assessment & Plan:   Problem List Items Addressed This Visit   None   Outpatient Encounter Medications as of 09/26/2020  Medication Sig    Multiple Vitamin (MULTIVITAMIN) tablet Take 1 tablet by mouth daily.   No facility-administered encounter medications on file as of 09/26/2020.    Follow-up: No follow-ups on file.   Asencion Noble, MD

## 2020-09-26 NOTE — Assessment & Plan Note (Addendum)
Substernal chest pain seems to be related to GERD, esophagitis and hiatal hernia than cardiac etiology. He will resume antacid and follow up with his GI. Instructed patient to call the office if his pain becomes more constant or occurs with exertion.

## 2020-09-27 LAB — URINALYSIS
Bilirubin, UA: NEGATIVE
Glucose, UA: NEGATIVE
Ketones, UA: NEGATIVE
Leukocytes,UA: NEGATIVE
Nitrite, UA: NEGATIVE
RBC, UA: NEGATIVE
Specific Gravity, UA: 1.02 (ref 1.005–1.030)
Urobilinogen, Ur: 0.2 mg/dL (ref 0.2–1.0)
pH, UA: 6 (ref 5.0–7.5)

## 2020-09-27 LAB — BASIC METABOLIC PANEL
BUN/Creatinine Ratio: 9 (ref 9–20)
BUN: 8 mg/dL (ref 6–24)
CO2: 25 mmol/L (ref 20–29)
Calcium: 8.8 mg/dL (ref 8.7–10.2)
Chloride: 105 mmol/L (ref 96–106)
Creatinine, Ser: 0.88 mg/dL (ref 0.76–1.27)
Glucose: 88 mg/dL (ref 65–99)
Potassium: 4.4 mmol/L (ref 3.5–5.2)
Sodium: 143 mmol/L (ref 134–144)
eGFR: 103 mL/min/{1.73_m2} (ref >59)

## 2020-09-27 LAB — HEMOGLOBIN A1C
Est. average glucose Bld gHb Est-mCnc: 114 mg/dL
Hgb A1c MFr Bld: 5.6 % (ref 4.8–5.6)

## 2020-09-29 ENCOUNTER — Telehealth: Payer: Self-pay

## 2020-09-29 NOTE — Telephone Encounter (Signed)
-----   Message from Elsie Stain, MD sent at 09/27/2020  6:05 AM EDT ----- Let pt know No diabetes, no evidence of kidney stone, kidney normal

## 2020-09-29 NOTE — Telephone Encounter (Signed)
Patient was called and a voicemail was left informing patient to return phone call for lab results.   CRM created.

## 2021-01-30 ENCOUNTER — Other Ambulatory Visit: Payer: Self-pay | Admitting: Critical Care Medicine

## 2021-01-30 ENCOUNTER — Ambulatory Visit: Payer: 59 | Attending: Critical Care Medicine | Admitting: Critical Care Medicine

## 2021-01-30 ENCOUNTER — Other Ambulatory Visit: Payer: Self-pay

## 2021-01-30 ENCOUNTER — Encounter: Payer: Self-pay | Admitting: Critical Care Medicine

## 2021-01-30 VITALS — BP 137/87 | HR 80 | Resp 16 | Ht 65.0 in | Wt 163.4 lb

## 2021-01-30 DIAGNOSIS — K22710 Barrett's esophagus with low grade dysplasia: Secondary | ICD-10-CM

## 2021-01-30 DIAGNOSIS — B351 Tinea unguium: Secondary | ICD-10-CM | POA: Diagnosis not present

## 2021-01-30 DIAGNOSIS — D229 Melanocytic nevi, unspecified: Secondary | ICD-10-CM | POA: Insufficient documentation

## 2021-01-30 DIAGNOSIS — R030 Elevated blood-pressure reading, without diagnosis of hypertension: Secondary | ICD-10-CM

## 2021-01-30 MED ORDER — PANTOPRAZOLE SODIUM 40 MG PO TBEC: 40.0000 mg | DELAYED_RELEASE_TABLET | Freq: Every day | ORAL | 4 refills | Status: DC

## 2021-01-30 MED ORDER — CICLOPIROX 8 % EX SOLN: Freq: Every day | CUTANEOUS | 0 refills | Status: DC

## 2021-01-30 MED ORDER — SUCRALFATE 1 G PO TABS: 1.0000 g | ORAL_TABLET | Freq: Every day | ORAL | 4 refills | Status: DC

## 2021-01-30 NOTE — Telephone Encounter (Signed)
Requested medication (s) are due for refill today: see request for alternative   Requested medication (s) are on the active medication list: yes  Last refill:  01/30/21 #6.6 ml 0 refill  Future visit scheduled: yes in 3 months   Notes to clinic:  Pharmacy comment: Alternative Requested, medication not assigned to a protocol     Requested Prescriptions  Pending Prescriptions Disp Refills   ciclopirox (PENLAC) 8 % solution [Pharmacy Med Name: CICLOPIROX 8% SOLUTION] 6.6 mL 0    Sig: Apply topically at bedtime. Apply over nail and surrounding skin. Apply daily over previous coat. After seven (7) days, may remove with alcohol and continue cycle.     Off-Protocol Failed - 01/30/2021 12:03 PM      Failed - Medication not assigned to a protocol, review manually.      Passed - Valid encounter within last 12 months    Recent Outpatient Visits           Today Barrett's esophagus with low grade dysplasia   [facility], [place]   4 months ago History of kidney stones   [facility], [place]   11 months ago Elevated blood pressure reading in office without diagnosis of hypertension   [place], [place]   9 years ago Skin infection   [facility], [facility]       Future Appointments             In 3 months Joya Gaskins Burnett Harry, MD [place]

## 2021-01-30 NOTE — Assessment & Plan Note (Signed)
Improved symptoms with treatment continue Carafate and Protonix daily and healthy diet

## 2021-01-30 NOTE — Assessment & Plan Note (Signed)
Multiple benign nevi and seborrheic keratosis on skin no evidence of melanoma

## 2021-01-30 NOTE — Progress Notes (Signed)
Established Patient Office Visit  Subjective:  Patient ID: Roberto Fisher, male    DOB: 1970/09/13  Age: 50 y.o. MRN: [id number]  CC: No chief complaint on file.   HPI Roberto Fisher presents for  09/26/20 [name] presents for a new patient evaluation to establish primary care. He presents with a list of minor complaints to discuss such as hemorrhoids, tinnitus, headaches and a recent urgent care visit regarding substernal chest pains. Starting in mid 2021, he will experience a retrosternal chest "pressure" when he is at home. The pain occurs randomly and will last a few hours before resolving on its own without any no known alleviating factors. He has not tried any medication for the problem and in addition to chest pain he will sometimes also experience a headache. He denies trauma, syncope or vision changes. 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 He says the pain never bothers him at night nor while at work, only bothersome when he is at home. He denies fevers or recent illness, cough or shortness of breath. He does not drink alcohol, use tobacco products or any other illicit substances. On 09/11/20 he went to urgent care because of the pain and he was told to proceed to the ER for a further cardiac work-up but he declined due to cost.    In February 2022, he underwent a screening colonoscopy and diagnostic endoscopy due to progressive esophagitis and dysphagia. Work up shows some Barrett's esophagitis and a mild hiatal hernia. The patient often has heartburn and sleeps propped up in bed. Today he denies abd pain as well as NVD. He was taking an antacid but stopped. Colonoscopy was WNL- may repeat in 10 years.    He states his only past medical history is positive for ADHD. He does not take any medicine for this and states that his "nerves" often get to him. Patient does not describe himself as an anxious or ruminating person but appears  hypervigilant about his health and is quite talkative. He denies feeling depressed, thoughts of self harm nor hallucinations.   Additionally he mentions an odd complaint regarding to intermittent tinnitus. He says he regularly cleans his ears with Q tips and rinses with water. I cautioned him against using Q tips and recommended gentle flushing while in the shower.    He also mentioned he intermittently struggles with hemorrhoids. He has had them for years and will use OTC cream on an as needed basis.  He also has had a kidney stone and requests a urinalysis for his peace of mind. He denies any back pains or hematuria. No further workup was elaborated on these problems due to time constraints.   01/30/21 Patient seen in return follow-up for Barrett's esophagitis overall he is improved.  He is on Carafate daily and pantoprazole daily with improvement in abdominal and chest pain.  He still has minimal tinnitus.  He complains of toenail fungus.  On arrival blood pressure 137/87.  He follows a healthy diet.  There are no other complaints now.  Patient declined tetanus shot and flu vaccine he is giving consideration shingles vaccination Past Medical History:  Diagnosis Date   Abscess of abdominal wall 07/09/2013   ADHD (attention deficit hyperactivity disorder)    Pollen allergies     Past Surgical History:  Procedure Laterality Date   INCISION AND DRAINAGE ABSCESS N/A 06/06/2013   Procedure: INCISION AND DRAINAGE DEBRIDEMENT OF ABDOMINAL WALL;  Surgeon: Imogene Burn.  Georgette Dover, MD;  Location: [facility];  Service: General;  Laterality: N/A;    Family History  Problem Relation Age of Onset   Hypertension Mother    Diabetes Mother    Diabetes Mellitus II Father    Dementia Father    Heart disease Father    Hypertension Father    Hyperlipidemia Father    COPD Father    Hypertension Maternal Grandmother    Hypertension Maternal Grandfather     Social History   Socioeconomic History   Marital  status: Single    Spouse name: Not on file   Number of children: 0   Years of education: Not on file   Highest education level: Not on file  Occupational History   Occupation: landscaping/lawn care  Tobacco Use   Smoking status: Never   Smokeless tobacco: Never  Vaping Use   Vaping Use: Never used  Substance and Sexual Activity   Alcohol use: Yes    Comment: occasional   Drug use: No   Sexual activity: Not on file  Other Topics Concern   Not on file  Social History Narrative   Not on file   Social Determinants of Health   Financial Resource Strain: Not on file  Food Insecurity: Not on file  Transportation Needs: Not on file  Physical Activity: Not on file  Stress: Not on file  Social Connections: Not on file  Intimate Partner Violence: Not on file    Outpatient Medications Prior to Visit  Medication Sig Dispense Refill   Multiple Vitamin (MULTIVITAMIN) tablet Take 1 tablet by mouth daily.     pantoprazole (PROTONIX) 40 MG tablet Take 1 tablet (40 mg total) by mouth daily before breakfast. 60 tablet 3   Melatonin-Pyridoxine (MELATIN) 3-1 MG TABS      No facility-administered medications prior to visit.    No Known Allergies  ROS Review of Systems  Constitutional: Negative.   HENT: Negative.  Negative for ear pain, postnasal drip, rhinorrhea, sinus pressure, sore throat, trouble swallowing and voice change.   Eyes: Negative.   Respiratory: Negative.  Negative for apnea, cough, choking, chest tightness, shortness of breath, wheezing and stridor.   Cardiovascular: Negative.  Negative for chest pain, palpitations and leg swelling.  Gastrointestinal: Negative.  Negative for abdominal distention, abdominal pain, nausea and vomiting.  Genitourinary: Negative.   Musculoskeletal: Negative.  Negative for arthralgias and myalgias.  Skin: Negative.  Negative for rash.       Toenail fungus in all toes bilaterally  Allergic/Immunologic: Negative.  Negative for environmental  allergies and food allergies.  Neurological: Negative.  Negative for dizziness, syncope, weakness and headaches.  Hematological: Negative.  Negative for adenopathy. Does not bruise/bleed easily.  Psychiatric/Behavioral: Negative.  Negative for agitation and sleep disturbance. The patient is not nervous/anxious.      Objective:    Physical Exam Vitals reviewed.  Constitutional:      Appearance: Normal appearance. He is well-developed. He is not diaphoretic.  HENT:     Head: Normocephalic and atraumatic.     Nose: No nasal deformity, septal deviation, mucosal edema or rhinorrhea.     Right Sinus: No maxillary sinus tenderness or frontal sinus tenderness.     Left Sinus: No maxillary sinus tenderness or frontal sinus tenderness.     Mouth/Throat:     Pharynx: No oropharyngeal exudate.  Eyes:     General: No scleral icterus.    Conjunctiva/sclera: Conjunctivae normal.     Pupils: Pupils are equal, round, and reactive  to light.  Neck:     Thyroid: No thyromegaly.     Vascular: No carotid bruit or JVD.     Trachea: Trachea normal. No tracheal tenderness or tracheal deviation.  Cardiovascular:     Rate and Rhythm: Normal rate and regular rhythm.     Chest Wall: PMI is not displaced.     Pulses: Normal pulses. No decreased pulses.     Heart sounds: Normal heart sounds, S1 normal and S2 normal. Heart sounds not distant. No murmur heard. No systolic murmur is present.  No diastolic murmur is present.    No friction rub. No gallop. No S3 or S4 sounds.  Pulmonary:     Effort: No tachypnea, accessory muscle usage or respiratory distress.     Breath sounds: No stridor. No decreased breath sounds, wheezing, rhonchi or rales.  Chest:     Chest wall: No tenderness.  Abdominal:     General: Abdomen is flat. Bowel sounds are normal. There is no distension.     Palpations: Abdomen is soft. Abdomen is not rigid. There is no mass.     Tenderness: There is no abdominal tenderness. There is no  guarding or rebound.     Hernia: No hernia is present.  Musculoskeletal:        General: Normal range of motion.     Cervical back: Normal range of motion and neck supple. No edema, erythema or rigidity. No muscular tenderness. Normal range of motion.  Lymphadenopathy:     Head:     Right side of head: No submental or submandibular adenopathy.     Left side of head: No submental or submandibular adenopathy.     Cervical: No cervical adenopathy.  Skin:    General: Skin is warm and dry.     Coloration: Skin is not jaundiced or pale.     Findings: No bruising, erythema, lesion or rash.     Nails: There is no clubbing.     Comments: Toenail fungus bilaterally all toes  Skin is thoroughly examined from head to feet with no evidence of melanoma seen only benign nevi and seborrheic keratosis  Neurological:     Mental Status: He is alert and oriented to person, place, and time.     Sensory: No sensory deficit.  Psychiatric:        Mood and Affect: Mood normal.        Speech: Speech normal.        Behavior: Behavior normal.        Thought Content: Thought content normal.        Judgment: Judgment normal.    BP 137/87   Pulse 80   Resp 16   Ht $R'5\' 5"'hc$  (1.651 m)   Wt 163 lb 6.4 oz (74.1 kg)   SpO2 93%   BMI 27.19 kg/m  Wt Readings from Last 3 Encounters:  01/30/21 163 lb 6.4 oz (74.1 kg)  09/26/20 165 lb (74.8 kg)  06/16/20 168 lb (76.2 kg)     Health Maintenance Due  Topic Date Due   TETANUS/TDAP  Never done   Zoster Vaccines- Shingrix (1 of 2) Never done    There are no preventive care reminders to display for this patient.  Lab Results  Component Value Date   TSH 1.350 02/21/2020   Lab Results  Component Value Date   WBC 8.9 02/21/2020   HGB 13.9 02/21/2020   HCT 41.3 02/21/2020   MCV 83 02/21/2020   PLT 311 02/21/2020  Lab Results  Component Value Date   NA 143 09/26/2020   K 4.4 09/26/2020   CO2 25 09/26/2020   GLUCOSE 88 09/26/2020   BUN 8 09/26/2020    CREATININE 0.88 09/26/2020   BILITOT 0.3 02/21/2020   ALKPHOS 79 02/21/2020   AST 15 02/21/2020   ALT 9 06/04/2013   PROT 7.0 02/21/2020   ALBUMIN 4.3 02/21/2020   CALCIUM 8.8 09/26/2020   ANIONGAP 8 10/12/2014   EGFR 103 09/26/2020   Lab Results  Component Value Date   CHOL 171 02/21/2020   Lab Results  Component Value Date   HDL 52 02/21/2020   Lab Results  Component Value Date   LDLCALC 109 (H) 02/21/2020   Lab Results  Component Value Date   TRIG 49 02/21/2020   Lab Results  Component Value Date   CHOLHDL 3.3 02/21/2020   Lab Results  Component Value Date   HGBA1C 5.6 09/26/2020  GAD-7 PHQ-9 low    Assessment & Plan:   Problem List Items Addressed This Visit       Digestive   Barrett esophagus    Improved symptoms with treatment continue Carafate and Protonix daily and healthy diet        Musculoskeletal and Integument   Onychomycosis    Mild onychomycosis of both lower feet toenails  Trial of Penlac daily      Relevant Medications   ciclopirox (PENLAC) 8 % solution   Multiple benign nevi    Multiple benign nevi and seborrheic keratosis on skin no evidence of melanoma        Other   Elevated blood pressure reading in office without diagnosis of hypertension    Blood pressure remains low no need for therapy       Meds ordered this encounter  Medications   pantoprazole (PROTONIX) 40 MG tablet    Sig: Take 1 tablet (40 mg total) by mouth daily before breakfast.    Dispense:  60 tablet    Refill:  4   ciclopirox (PENLAC) 8 % solution    Sig: Apply topically at bedtime. Apply over nail and surrounding skin. Apply daily over previous coat. After seven (7) days, may remove with alcohol and continue cycle.    Dispense:  6.6 mL    Refill:  0   sucralfate (CARAFATE) 1 g tablet    Sig: Take 1 tablet (1 g total) by mouth daily.    Dispense:  60 tablet    Refill:  4    Follow-up: Return in about 4 months (around 05/30/2021).    Asencion Noble, MD

## 2021-01-30 NOTE — Patient Instructions (Signed)
You declined the flu vaccine and tetanus vaccine  You are giving consideration to receiving the shingles vaccine we will discuss at the next visit  Refills on Carafate and pantoprazole sent to your pharmacy  Trial of Penlac applied all toenails once daily until eval empties, cut your toenails as they grow out and hopefully normal nail will grow in, if this does not occur we will send you to podiatry  Return to see Dr. Joya Gaskins 4 months  We are getting you a Mychart sign up

## 2021-01-30 NOTE — Assessment & Plan Note (Signed)
Mild onychomycosis of both lower feet toenails  Trial of Penlac daily

## 2021-01-30 NOTE — Assessment & Plan Note (Signed)
Blood pressure remains low no need for therapy

## 2021-01-31 NOTE — Telephone Encounter (Signed)
See pharmacy note-prior authorization needed. Routing to clinic.

## 2021-05-29 ENCOUNTER — Ambulatory Visit: Payer: 59 | Admitting: Critical Care Medicine

## 2021-05-30 ENCOUNTER — Ambulatory Visit: Payer: 59 | Admitting: Critical Care Medicine

## 2021-05-30 ENCOUNTER — Ambulatory Visit: Payer: Self-pay | Attending: Critical Care Medicine | Admitting: Critical Care Medicine

## 2021-05-30 ENCOUNTER — Other Ambulatory Visit: Payer: Self-pay

## 2021-05-30 ENCOUNTER — Encounter: Payer: Self-pay | Admitting: Critical Care Medicine

## 2021-05-30 VITALS — BP 132/74 | HR 74 | Temp 99.1°F | Resp 18 | Ht 65.0 in | Wt 176.0 lb

## 2021-05-30 DIAGNOSIS — Z125 Encounter for screening for malignant neoplasm of prostate: Secondary | ICD-10-CM | POA: Insufficient documentation

## 2021-05-30 DIAGNOSIS — R1031 Right lower quadrant pain: Secondary | ICD-10-CM

## 2021-05-30 DIAGNOSIS — Z139 Encounter for screening, unspecified: Secondary | ICD-10-CM | POA: Insufficient documentation

## 2021-05-30 DIAGNOSIS — R1011 Right upper quadrant pain: Secondary | ICD-10-CM | POA: Insufficient documentation

## 2021-05-30 DIAGNOSIS — Z87442 Personal history of urinary calculi: Secondary | ICD-10-CM

## 2021-05-30 DIAGNOSIS — R030 Elevated blood-pressure reading, without diagnosis of hypertension: Secondary | ICD-10-CM

## 2021-05-30 DIAGNOSIS — B351 Tinea unguium: Secondary | ICD-10-CM

## 2021-05-30 LAB — POCT URINALYSIS DIP (CLINITEK)
Bilirubin, UA: NEGATIVE
Blood, UA: NEGATIVE
Glucose, UA: NEGATIVE mg/dL
Ketones, POC UA: NEGATIVE mg/dL
Leukocytes, UA: NEGATIVE
Nitrite, UA: NEGATIVE
POC PROTEIN,UA: NEGATIVE
Spec Grav, UA: 1.02 (ref 1.010–1.025)
Urobilinogen, UA: 0.2 E.U./dL
pH, UA: 7 (ref 5.0–8.0)

## 2021-05-30 MED ORDER — DICYCLOMINE HCL 20 MG PO TABS: 20.0000 mg | ORAL_TABLET | Freq: Four times a day (QID) | ORAL | 0 refills | Status: DC

## 2021-05-30 MED ORDER — PANTOPRAZOLE SODIUM 40 MG PO TBEC
40.0000 mg | DELAYED_RELEASE_TABLET | Freq: Every day | ORAL | 4 refills | Status: DC
Start: 2021-05-30 — End: 2021-10-31

## 2021-05-30 MED ORDER — SUCRALFATE 1 G PO TABS: 1.0000 g | ORAL_TABLET | Freq: Every day | ORAL | 4 refills | Status: DC

## 2021-05-30 NOTE — Assessment & Plan Note (Signed)
Blood pressure normal at this visit ?

## 2021-05-30 NOTE — Assessment & Plan Note (Signed)
New onset right upper quadrant abdominal pain we will check screening labs and obtain a full abdominal ultrasound and give Bentyl as needed for muscle cramps and abdomen ?

## 2021-05-30 NOTE — Assessment & Plan Note (Signed)
Doubt current symptom complex is due to kidney stones his urinalysis was negative ?

## 2021-05-30 NOTE — Progress Notes (Signed)
? ?Established Patient Office Visit ? ?Subjective:  ?Patient ID: Roberto Fisher, male    DOB: 20-Jun-1970  Age: 51 y.o. MRN: [id number] ? ?CC:  ?Chief Complaint  ?Patient presents with  ? Annual Exam  ? ? ?HPI ?Roberto Fisher presents for annual exam on 05/30/2021 ?09/26/20 ?Roberto Fisher presents for a new patient evaluation to establish primary care. He presents with a list of minor complaints to discuss such as hemorrhoids, tinnitus, headaches and a recent urgent care visit regarding substernal chest pains. Starting in mid 2021, he will experience a retrosternal chest "pressure" when he is at home. The pain occurs randomly and will last a few hours before resolving on its own without any no known alleviating factors. He has not tried any medication for the problem and in addition to chest pain he will sometimes also experience a headache. He denies trauma, syncope or vision changes. 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 He says the pain never bothers him at night nor while at work, only bothersome when he is at home. He denies fevers or recent illness, cough or shortness of breath. He does not drink alcohol, use tobacco products or any other illicit substances. On 09/11/20 he went to urgent care because of the pain and he was told to proceed to the ER for a further cardiac work-up but he declined due to cost.  ?  ?In February 2022, he underwent a screening colonoscopy and diagnostic endoscopy due to progressive esophagitis and dysphagia. Work up shows some Barrett's esophagitis and a mild hiatal hernia. The patient often has heartburn and sleeps propped up in bed. Today he denies abd pain as well as NVD. He was taking an antacid but stopped. Colonoscopy was WNL- may repeat in 10 years.  ?  ?He states his only past medical history is positive for ADHD. He does not take any medicine for this and states that his "nerves" often get to him. Patient does not describe  himself as an anxious or ruminating person but appears hypervigilant about his health and is quite talkative. He denies feeling depressed, thoughts of self harm nor hallucinations. ?  ?Additionally he mentions an odd complaint regarding to intermittent tinnitus. He says he regularly cleans his ears with Q tips and rinses with water. I cautioned him against using Q tips and recommended gentle flushing while in the shower.  ?  ?He also mentioned he intermittently struggles with hemorrhoids. He has had them for years and will use OTC cream on an as needed basis.  He also has had a kidney stone and requests a urinalysis for his peace of mind. He denies any back pains or hematuria. No further workup was elaborated on these problems due to time constraints.  ? ?01/30/21 ?Patient seen in return follow-up for Barrett's esophagitis overall he is improved.  He is on Carafate daily and pantoprazole daily with improvement in abdominal and chest pain.  He still has minimal tinnitus.  He complains of toenail fungus.  On arrival blood pressure 137/87.  He follows a healthy diet.  There are no other complaints now. ? ?Patient declined tetanus shot and flu vaccine he is giving consideration shingles vaccination ? ?05/30/21 ?Patient presents for an annual exam he has complaints of right upper quadrant pain that is been going on for 2 days he does have history of renal stones.  Point-of-care urinalysis was normal.  He still complains of toenail fungus he was unable to afford  the medication we gave his co-pay is too high with his current insurance. ? ?He has no nausea or vomiting there is no blood in the urine no blood in the stool no other associated symptoms of the right upper quadrant abdominal pain. ? ?Barrett esophagus   ?    Improved symptoms with treatment continue Carafate and Protonix daily and healthy diet ?   ?  ?    ?  Musculoskeletal and Integument  ?  Onychomycosis  ?    Mild onychomycosis of both lower feet toenails ?   ?Trial of Penlac daily ?   ?  ?  Relevant Medications  ?  ciclopirox (PENLAC) 8 % solution  ?  Multiple benign nevi  ?    Multiple benign nevi and seborrheic keratosis on skin no evidence of melanoma ?   ?  ?    ?  Other  ?  Elevated blood pressure reading in office without diagnosis of hypertension  ?    Blood pressure remains low no need for therapy ?   ?  ? ?Past Medical History:  ?Diagnosis Date  ? Abscess of abdominal wall 07/09/2013  ? ADHD (attention deficit hyperactivity disorder)   ? Pollen allergies   ? ? ?Past Surgical History:  ?Procedure Laterality Date  ? INCISION AND DRAINAGE ABSCESS N/A 06/06/2013  ? Procedure: INCISION AND DRAINAGE DEBRIDEMENT OF ABDOMINAL WALL;  Fisher: Imogene Burn. Georgette Dover, MD;  Location: [facility];  Service: General;  Laterality: N/A;  ? ? ?Family History  ?Problem Relation Age of Onset  ? Hypertension Mother   ? Diabetes Mother   ? Diabetes Mellitus II Father   ? Dementia Father   ? Heart disease Father   ? Hypertension Father   ? Hyperlipidemia Father   ? COPD Father   ? Hypertension Maternal Grandmother   ? Hypertension Maternal Grandfather   ? ? ?Social History  ? ?Socioeconomic History  ? Marital status: Single  ?  Spouse name: Not on file  ? Number of children: 0  ? Years of education: Not on file  ? Highest education level: Not on file  ?Occupational History  ? Occupation: landscaping/lawn care  ?Tobacco Use  ? Smoking status: Never  ? Smokeless tobacco: Never  ?Vaping Use  ? Vaping Use: Never used  ?Substance and Sexual Activity  ? Alcohol use: Yes  ?  Comment: occasional  ? Drug use: No  ? Sexual activity: Not on file  ?Other Topics Concern  ? Not on file  ?Social History Narrative  ? Not on file  ? ?Social Determinants of Health  ? ?Financial Resource Strain: Not on file  ?Food Insecurity: Not on file  ?Transportation Needs: Not on file  ?Physical Activity: Not on file  ?Stress: Not on file  ?Social Connections: Not on file  ?Intimate Partner Violence: Not on file   ? ? ?Outpatient Medications Prior to Visit  ?Medication Sig Dispense Refill  ? Multiple Vitamin (MULTIVITAMIN) tablet Take 1 tablet by mouth daily.    ? pantoprazole (PROTONIX) 40 MG tablet Take 1 tablet (40 mg total) by mouth daily before breakfast. 60 tablet 4  ? sucralfate (CARAFATE) 1 g tablet Take 1 tablet (1 g total) by mouth daily. 60 tablet 4  ? ciclopirox (PENLAC) 8 % solution APPLY TOPICALLY AT BEDTIME. APPLY OVER NAIL AND SURROUNDING SKIN. APPLY DAILY OVER PREVIOUS COAT. AFTER SEVEN (7) DAYS, MAY REMOVE WITH ALCOHOL AND CONTINUE CYCLE. (Patient not taking: Reported on 05/30/2021) 6.6 mL 0  ? ?  No facility-administered medications prior to visit.  ? ? ?No Known Allergies ? ?ROS ?Review of Systems  ?Constitutional: Negative.  Negative for chills, diaphoresis and fever.  ?HENT: Negative.  Negative for congestion, ear pain, hearing loss, nosebleeds, postnasal drip, rhinorrhea, sinus pressure, sore throat, tinnitus, trouble swallowing and voice change.   ?Eyes: Negative.  Negative for photophobia and redness.  ?Respiratory: Negative.  Negative for apnea, cough, choking, chest tightness, shortness of breath, wheezing and stridor.   ?Cardiovascular: Negative.  Negative for chest pain, palpitations and leg swelling.  ?Gastrointestinal:  Positive for abdominal pain. Negative for abdominal distention, anal bleeding, blood in stool, constipation, diarrhea, nausea, rectal pain and vomiting.  ?Endocrine: Negative for polydipsia.  ?Genitourinary: Negative.  Negative for decreased urine volume, dysuria, enuresis, flank pain, frequency, genital sores, hematuria, penile discharge, penile pain, penile swelling, scrotal swelling, testicular pain and urgency.  ?Musculoskeletal: Negative.  Negative for arthralgias, back pain, myalgias and neck pain.  ?Skin: Negative.  Negative for rash.  ?     Toenail fungus in all toes bilaterally  ?Allergic/Immunologic: Negative.  Negative for environmental allergies and food allergies.   ?Neurological: Negative.  Negative for dizziness, tremors, seizures, syncope, weakness and headaches.  ?Hematological: Negative.  Negative for adenopathy. Does not bruise/bleed easily.  ?Psychiatric/Behavioral: Negative.

## 2021-05-30 NOTE — Progress Notes (Signed)
Patient has not eaten or taken medication today. ?Patient complains of R side abdominal pain with a history of kidney stones. Pain is scaled currently at a 4. ? ?

## 2021-05-30 NOTE — Patient Instructions (Signed)
Complete screening labs today ? ?Ultra sound of abdomen will be obtained ? ?No medication changes for now ? ?Bentyl given for abdominal cramp ? ?Follow a healthy diet for abdominal bloating as attached ? ?Results will be called ? ?Return Dr Joya Gaskins 5 months ?

## 2021-05-30 NOTE — Assessment & Plan Note (Signed)
Rectal exam normal but will proceed with PSA ?

## 2021-05-30 NOTE — Assessment & Plan Note (Signed)
Not able to afford previously prescribed medication ? ?We will recommend over-the-counter toenail fungus cream ?

## 2021-05-31 ENCOUNTER — Telehealth: Payer: Self-pay

## 2021-05-31 LAB — CBC WITH DIFFERENTIAL/PLATELET
Basophils Absolute: 0 10*3/uL (ref 0.0–0.2)
Basos: 1 %
EOS (ABSOLUTE): 0.1 10*3/uL (ref 0.0–0.4)
Eos: 1 %
Hematocrit: 42.1 % (ref 37.5–51.0)
Hemoglobin: 13.7 g/dL (ref 13.0–17.7)
Immature Grans (Abs): 0 10*3/uL (ref 0.0–0.1)
Immature Granulocytes: 0 %
Lymphocytes Absolute: 1.7 10*3/uL (ref 0.7–3.1)
Lymphs: 22 %
MCH: 26.9 pg (ref 26.6–33.0)
MCHC: 32.5 g/dL (ref 31.5–35.7)
MCV: 83 fL (ref 79–97)
Monocytes Absolute: 0.8 10*3/uL (ref 0.1–0.9)
Monocytes: 10 %
Neutrophils Absolute: 5.2 10*3/uL (ref 1.4–7.0)
Neutrophils: 66 %
Platelets: 250 10*3/uL (ref 150–450)
RBC: 5.09 x10E6/uL (ref 4.14–5.80)
RDW: 12.3 % (ref 11.6–15.4)
WBC: 7.7 10*3/uL (ref 3.4–10.8)

## 2021-05-31 LAB — COMPREHENSIVE METABOLIC PANEL
ALT: 24 IU/L (ref 0–44)
AST: 22 IU/L (ref 0–40)
Albumin/Globulin Ratio: 1.7 (ref 1.2–2.2)
Albumin: 4.4 g/dL (ref 3.8–4.9)
Alkaline Phosphatase: 93 IU/L (ref 44–121)
BUN/Creatinine Ratio: 12 (ref 9–20)
BUN: 11 mg/dL (ref 6–24)
Bilirubin Total: 0.3 mg/dL (ref 0.0–1.2)
CO2: 25 mmol/L (ref 20–29)
Calcium: 8.8 mg/dL (ref 8.7–10.2)
Chloride: 103 mmol/L (ref 96–106)
Creatinine, Ser: 0.9 mg/dL (ref 0.76–1.27)
Globulin, Total: 2.6 g/dL (ref 1.5–4.5)
Glucose: 88 mg/dL (ref 70–99)
Potassium: 4.5 mmol/L (ref 3.5–5.2)
Sodium: 140 mmol/L (ref 134–144)
Total Protein: 7 g/dL (ref 6.0–8.5)
eGFR: 103 mL/min/{1.73_m2} (ref >59)

## 2021-05-31 LAB — LIPID PANEL
Chol/HDL Ratio: 3.6 ratio (ref 0.0–5.0)
Cholesterol, Total: 192 mg/dL (ref 100–199)
HDL: 54 mg/dL (ref >39)
LDL Chol Calc (NIH): 126 mg/dL — ABNORMAL HIGH (ref 0–99)
Triglycerides: 62 mg/dL (ref 0–149)
VLDL Cholesterol Cal: 12 mg/dL (ref 5–40)

## 2021-05-31 LAB — THYROID PANEL WITH TSH
Free Thyroxine Index: 1.6 (ref 1.2–4.9)
T3 Uptake Ratio: 25 % (ref 24–39)
T4, Total: 6.3 ug/dL (ref 4.5–12.0)
TSH: 1.83 u[IU]/mL (ref 0.450–4.500)

## 2021-05-31 LAB — PSA: Prostate Specific Ag, Serum: 2.3 ng/mL (ref 0.0–4.0)

## 2021-05-31 NOTE — Telephone Encounter (Signed)
Pt was called and is aware of results, DOB was confirmed.  ?

## 2021-05-31 NOTE — Telephone Encounter (Signed)
-----   Message from Elsie Stain, MD sent at 05/31/2021  6:04 AM EDT ----- ?Let pt know cholesterol is slightly high recommend healthy diet , low fat, ?Psa is neg  no prostate cancer.   Blood count normal, liver kidney normal, thyroid normal ?

## 2021-10-31 ENCOUNTER — Ambulatory Visit: Payer: 59 | Attending: Critical Care Medicine | Admitting: Critical Care Medicine

## 2021-10-31 ENCOUNTER — Encounter: Payer: Self-pay | Admitting: Critical Care Medicine

## 2021-10-31 VITALS — BP 137/84 | HR 74 | Ht 65.0 in | Wt 166.8 lb

## 2021-10-31 DIAGNOSIS — K22710 Barrett's esophagus with low grade dysplasia: Secondary | ICD-10-CM

## 2021-10-31 DIAGNOSIS — Z125 Encounter for screening for malignant neoplasm of prostate: Secondary | ICD-10-CM | POA: Diagnosis not present

## 2021-10-31 DIAGNOSIS — R1011 Right upper quadrant pain: Secondary | ICD-10-CM

## 2021-10-31 MED ORDER — DICYCLOMINE HCL 20 MG PO TABS: 20.0000 mg | ORAL_TABLET | Freq: Four times a day (QID) | ORAL | 0 refills | Status: DC | PRN

## 2021-10-31 MED ORDER — SUCRALFATE 1 G PO TABS: 1.0000 g | ORAL_TABLET | Freq: Every day | ORAL | 4 refills | Status: DC

## 2021-10-31 MED ORDER — PANTOPRAZOLE SODIUM 40 MG PO TBEC: 40.0000 mg | DELAYED_RELEASE_TABLET | Freq: Every day | ORAL | 4 refills | Status: DC

## 2021-10-31 NOTE — Assessment & Plan Note (Signed)
Referral back to gastroenterology

## 2021-10-31 NOTE — Assessment & Plan Note (Signed)
GI had wanted to see the patient in follow-up he did not get an appointment we will refer back to GI and continue pantoprazole and Carafate

## 2021-10-31 NOTE — Patient Instructions (Signed)
Referral back to gastroenterology was made for you to see Dr. Lyndel Safe again  Refills on pantoprazole Carafate and Bentyl were given  Return to see Dr. Joya Gaskins 4 months

## 2021-10-31 NOTE — Assessment & Plan Note (Signed)
PSA was negative

## 2021-10-31 NOTE — Progress Notes (Signed)
Established Patient Office Visit  Subjective:  Patient ID: Roberto Fisher, male    DOB: Aug 10, 1970  Age: 51 y.o. MRN: [id number]  CC:  Chief Complaint  Patient presents with   Hypertension   Medication Refill    HPI [name] presents for annual exam on 05/30/2021 09/26/20 Roberto Fisher presents for a new patient evaluation to establish primary care. He presents with a list of minor complaints to discuss such as hemorrhoids, tinnitus, headaches and a recent urgent care visit regarding substernal chest pains. Starting in mid 2021, he will experience a retrosternal chest "pressure" when he is at home. The pain occurs randomly and will last a few hours before resolving on its own without any no known alleviating factors. He has not tried any medication for the problem and in addition to chest pain he will sometimes also experience a headache. He denies trauma, syncope or vision changes. 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 He says the pain never bothers him at night nor while at work, only bothersome when he is at home. He denies fevers or recent illness, cough or shortness of breath. He does not drink alcohol, use tobacco products or any other illicit substances. On 09/11/20 he went to urgent care because of the pain and he was told to proceed to the ER for a further cardiac work-up but he declined due to cost.    In February 2022, he underwent a screening colonoscopy and diagnostic endoscopy due to progressive esophagitis and dysphagia. Work up shows some Barrett's esophagitis and a mild hiatal hernia. The patient often has heartburn and sleeps propped up in bed. Today he denies abd pain as well as NVD. He was taking an antacid but stopped. Colonoscopy was WNL- may repeat in 10 years.    He states his only past medical history is positive for ADHD. He does not take any medicine for this and states that his "nerves" often get to him.  Patient does not describe himself as an anxious or ruminating person but appears hypervigilant about his health and is quite talkative. He denies feeling depressed, thoughts of self harm nor hallucinations.   Additionally he mentions an odd complaint regarding to intermittent tinnitus. He says he regularly cleans his ears with Q tips and rinses with water. I cautioned him against using Q tips and recommended gentle flushing while in the shower.    He also mentioned he intermittently struggles with hemorrhoids. He has had them for years and will use OTC cream on an as needed basis.  He also has had a kidney stone and requests a urinalysis for his peace of mind. He denies any back pains or hematuria. No further workup was elaborated on these problems due to time constraints.   01/30/21 Patient seen in return follow-up for Barrett's esophagitis overall he is improved.  He is on Carafate daily and pantoprazole daily with improvement in abdominal and chest pain.  He still has minimal tinnitus.  He complains of toenail fungus.  On arrival blood pressure 137/87.  He follows a healthy diet.  There are no other complaints now.  Patient declined tetanus shot and flu vaccine he is giving consideration shingles vaccination  05/30/21 Patient presents for an annual exam he has complaints of right upper quadrant pain that is been going on for 2 days he does have history of renal stones.  Point-of-care urinalysis was normal.  He still complains of toenail fungus he was  unable to afford the medication we gave his co-pay is too high with his current insurance.  He has no nausea or vomiting there is no blood in the urine no blood in the stool no other associated symptoms of the right upper quadrant abdominal pain.  10/31/21 Patient returns today for medication refills his abdominal pain is improved as long as he takes pantoprazole and Carafate.  He does have Barrett's esophagus and needs follow-up with gastroenterology.   Toenail situation is improved with topical care.  Blood pressure on arrival excellent 137/84.  No recurrence of any kidney stones.  No other complaints.   Past Medical History:  Diagnosis Date   Abscess of abdominal wall 07/09/2013   ADHD (attention deficit hyperactivity disorder)    Pollen allergies     Past Surgical History:  Procedure Laterality Date   INCISION AND DRAINAGE ABSCESS N/A 06/06/2013   Procedure: INCISION AND DRAINAGE DEBRIDEMENT OF ABDOMINAL WALL;  Surgeon: Imogene Burn. Georgette Dover, MD;  Location: [facility];  Service: General;  Laterality: N/A;    Family History  Problem Relation Age of Onset   Hypertension Mother    Diabetes Mother    Diabetes Mellitus II Father    Dementia Father    Heart disease Father    Hypertension Father    Hyperlipidemia Father    COPD Father    Hypertension Maternal Grandmother    Hypertension Maternal Grandfather     Social History   Socioeconomic History   Marital status: Single    Spouse name: Not on file   Number of children: 0   Years of education: Not on file   Highest education level: Not on file  Occupational History   Occupation: landscaping/lawn care  Tobacco Use   Smoking status: Never   Smokeless tobacco: Never  Vaping Use   Vaping Use: Never used  Substance and Sexual Activity   Alcohol use: Yes    Comment: occasional   Drug use: No   Sexual activity: Not on file  Other Topics Concern   Not on file  Social History Narrative   Not on file   Social Determinants of Health   Financial Resource Strain: Not on file  Food Insecurity: Not on file  Transportation Needs: Not on file  Physical Activity: Not on file  Stress: Not on file  Social Connections: Not on file  Intimate Partner Violence: Not on file    Outpatient Medications Prior to Visit  Medication Sig Dispense Refill   Multiple Vitamin (MULTIVITAMIN) tablet Take 1 tablet by mouth daily.     dicyclomine (BENTYL) 20 MG tablet Take 1 tablet (20 mg total) by  mouth every 6 (six) hours. 20 tablet 0   pantoprazole (PROTONIX) 40 MG tablet Take 1 tablet (40 mg total) by mouth daily before breakfast. 60 tablet 4   sucralfate (CARAFATE) 1 g tablet Take 1 tablet (1 g total) by mouth daily. 60 tablet 4   No facility-administered medications prior to visit.    No Known Allergies  ROS Review of Systems  Constitutional: Negative.  Negative for chills, diaphoresis and fever.  HENT: Negative.  Negative for congestion, ear pain, hearing loss, nosebleeds, postnasal drip, rhinorrhea, sinus pressure, sore throat, tinnitus, trouble swallowing and voice change.   Eyes: Negative.  Negative for photophobia and redness.  Respiratory: Negative.  Negative for apnea, cough, choking, chest tightness, shortness of breath, wheezing and stridor.   Cardiovascular: Negative.  Negative for chest pain, palpitations and leg swelling.  Gastrointestinal:  Negative  for abdominal distention, abdominal pain, anal bleeding, blood in stool, constipation, diarrhea, nausea, rectal pain and vomiting.  Endocrine: Negative for polydipsia.  Genitourinary: Negative.  Negative for decreased urine volume, dysuria, enuresis, flank pain, frequency, genital sores, hematuria, penile discharge, penile pain, penile swelling, scrotal swelling, testicular pain and urgency.  Musculoskeletal: Negative.  Negative for arthralgias, back pain, myalgias and neck pain.  Skin: Negative.  Negative for rash.       Toenail fungus in all toes bilaterally  Allergic/Immunologic: Negative.  Negative for environmental allergies and food allergies.  Neurological: Negative.  Negative for dizziness, tremors, seizures, syncope, weakness and headaches.  Hematological: Negative.  Negative for adenopathy. Does not bruise/bleed easily.  Psychiatric/Behavioral: Negative.  Negative for agitation, sleep disturbance and suicidal ideas. The patient is not nervous/anxious.       Objective:    Physical Exam Vitals reviewed.   Constitutional:      Appearance: Normal appearance. He is well-developed. He is not diaphoretic.  HENT:     Head: Normocephalic and atraumatic.     Nose: No nasal deformity, septal deviation, mucosal edema or rhinorrhea.     Right Sinus: No maxillary sinus tenderness or frontal sinus tenderness.     Left Sinus: No maxillary sinus tenderness or frontal sinus tenderness.     Mouth/Throat:     Pharynx: No oropharyngeal exudate.  Eyes:     General: No scleral icterus.    Conjunctiva/sclera: Conjunctivae normal.     Pupils: Pupils are equal, round, and reactive to light.  Neck:     Thyroid: No thyromegaly.     Vascular: No carotid bruit or JVD.     Trachea: Trachea normal. No tracheal tenderness or tracheal deviation.  Cardiovascular:     Rate and Rhythm: Normal rate and regular rhythm.     Chest Wall: PMI is not displaced.     Pulses: Normal pulses. No decreased pulses.     Heart sounds: Normal heart sounds, S1 normal and S2 normal. Heart sounds not distant. No murmur heard.    No systolic murmur is present.     No diastolic murmur is present.     No friction rub. No gallop. No S3 or S4 sounds.  Pulmonary:     Effort: No tachypnea, accessory muscle usage or respiratory distress.     Breath sounds: No stridor. No decreased breath sounds, wheezing, rhonchi or rales.  Chest:     Chest wall: No tenderness.  Abdominal:     General: Abdomen is flat. Bowel sounds are normal. There is no distension.     Palpations: Abdomen is soft. Abdomen is not rigid. There is no mass.     Tenderness: There is no abdominal tenderness. There is no right CVA tenderness, left CVA tenderness, guarding or rebound.     Hernia: No hernia is present.  Genitourinary:    Penis: Normal.      Testes: Normal.     Prostate: Normal.     Rectum: Normal.  Musculoskeletal:        General: Normal range of motion.     Cervical back: Normal range of motion and neck supple. No edema, erythema or rigidity. No muscular  tenderness. Normal range of motion.  Lymphadenopathy:     Head:     Right side of head: No submental or submandibular adenopathy.     Left side of head: No submental or submandibular adenopathy.     Cervical: No cervical adenopathy.  Skin:    General: Skin is  warm and dry.     Coloration: Skin is not jaundiced or pale.     Findings: No bruising, erythema, lesion or rash.     Nails: There is no clubbing.     Comments: Toenail fungus bilaterally all toes  Skin is thoroughly examined from head to feet with no evidence of melanoma seen only benign nevi and seborrheic keratosis  Neurological:     Mental Status: He is alert and oriented to person, place, and time.     Sensory: No sensory deficit.  Psychiatric:        Mood and Affect: Mood normal.        Speech: Speech normal.        Behavior: Behavior normal.        Thought Content: Thought content normal.        Judgment: Judgment normal.     BP 137/84   Pulse 74   Ht _0  (1.651 m)   Wt 166 lb 12.8 oz (75.7 kg)   SpO2 97%   BMI 27.76 kg/m  Wt Readings from Last 3 Encounters:  10/31/21 166 lb 12.8 oz (75.7 kg)  05/30/21 176 lb (79.8 kg)  01/30/21 163 lb 6.4 oz (74.1 kg)     Health Maintenance Due  Topic Date Due   INFLUENZA VACCINE  10/09/2021     There are no preventive care reminders to display for this patient.  Lab Results  Component Value Date   TSH 1.830 05/30/2021   Lab Results  Component Value Date   WBC 7.7 05/30/2021   HGB 13.7 05/30/2021   HCT 42.1 05/30/2021   MCV 83 05/30/2021   PLT 250 05/30/2021   Lab Results  Component Value Date   NA 140 05/30/2021   K 4.5 05/30/2021   CO2 25 05/30/2021   GLUCOSE 88 05/30/2021   BUN 11 05/30/2021   CREATININE 0.90 05/30/2021   BILITOT 0.3 05/30/2021   ALKPHOS 93 05/30/2021   AST 22 05/30/2021   ALT 24 05/30/2021   PROT 7.0 05/30/2021   ALBUMIN 4.4 05/30/2021   CALCIUM 8.8 05/30/2021   ANIONGAP 8 10/12/2014   EGFR 103 05/30/2021   Lab Results   Component Value Date   CHOL 192 05/30/2021   Lab Results  Component Value Date   HDL 54 05/30/2021   Lab Results  Component Value Date   LDLCALC 126 (H) 05/30/2021   Lab Results  Component Value Date   TRIG 62 05/30/2021   Lab Results  Component Value Date   CHOLHDL 3.6 05/30/2021   Lab Results  Component Value Date   HGBA1C 5.6 09/26/2020  Urine dipstick shows negative for all components.  Micro exam: not done.     Assessment & Plan:   Problem List Items Addressed This Visit       Digestive   Barrett esophagus - Primary    GI had wanted to see the patient in follow-up he did not get an appointment we will refer back to GI and continue pantoprazole and Carafate      Relevant Orders   Ambulatory referral to Gastroenterology     Other   RUQ abdominal pain    Referral back to gastroenterology      Relevant Orders   Ambulatory referral to Gastroenterology   Prostate cancer screening    PSA was negative      Meds ordered this encounter  Medications   dicyclomine (BENTYL) 20 MG tablet    Sig: Take 1 tablet (  20 mg total) by mouth every 6 (six) hours as needed for spasms.    Dispense:  40 tablet    Refill:  0   pantoprazole (PROTONIX) 40 MG tablet    Sig: Take 1 tablet (40 mg total) by mouth daily before breakfast.    Dispense:  60 tablet    Refill:  4   sucralfate (CARAFATE) 1 g tablet    Sig: Take 1 tablet (1 g total) by mouth daily.    Dispense:  60 tablet    Refill:  4    Follow-up: Return in about 4 months (around 03/02/2022) for chronic conditions.    Asencion Noble, MD

## 2022-03-04 NOTE — Progress Notes (Unsigned)
Established Patient Office Visit  Subjective:  Patient ID: Roberto Fisher, male    DOB: 05-22-1970  Age: 51 y.o. MRN: [id number]  CC:  No chief complaint on file.   HPI Roberto Fisher presents for annual exam on 05/30/2021 09/26/20 Roberto Fisher presents for a new patient evaluation to establish primary care. He presents with a list of minor complaints to discuss such as hemorrhoids, tinnitus, headaches and a recent urgent care visit regarding substernal chest pains. Starting in mid 2021, he will experience a retrosternal chest "pressure" when he is at home. The pain occurs randomly and will last a few hours before resolving on its own without any no known alleviating factors. He has not tried any medication for the problem and in addition to chest pain he will sometimes also experience a headache. He denies trauma, syncope or vision changes. 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 He says the pain never bothers him at night nor while at work, only bothersome when he is at home. He denies fevers or recent illness, cough or shortness of breath. He does not drink alcohol, use tobacco products or any other illicit substances. On 09/11/20 he went to urgent care because of the pain and he was told to proceed to the ER for a further cardiac work-up but he declined due to cost.    In February 2022, he underwent a screening colonoscopy and diagnostic endoscopy due to progressive esophagitis and dysphagia. Work up shows some Barrett's esophagitis and a mild hiatal hernia. The patient often has heartburn and sleeps propped up in bed. Today he denies abd pain as well as NVD. He was taking an antacid but stopped. Colonoscopy was WNL- may repeat in 10 years.    He states his only past medical history is positive for ADHD. He does not take any medicine for this and states that his "nerves" often get to him. Patient does not describe himself as an anxious or  ruminating person but appears hypervigilant about his health and is quite talkative. He denies feeling depressed, thoughts of self harm nor hallucinations.   Additionally he mentions an odd complaint regarding to intermittent tinnitus. He says he regularly cleans his ears with Q tips and rinses with water. I cautioned him against using Q tips and recommended gentle flushing while in the shower.    He also mentioned he intermittently struggles with hemorrhoids. He has had them for years and will use OTC cream on an as needed basis.  He also has had a kidney stone and requests a urinalysis for his peace of mind. He denies any back pains or hematuria. No further workup was elaborated on these problems due to time constraints.   01/30/21 Patient seen in return follow-up for Barrett's esophagitis overall he is improved.  He is on Carafate daily and pantoprazole daily with improvement in abdominal and chest pain.  He still has minimal tinnitus.  He complains of toenail fungus.  On arrival blood pressure 137/87.  He follows a healthy diet.  There are no other complaints now.  Patient declined tetanus shot and flu vaccine he is giving consideration shingles vaccination  05/30/21 Patient presents for an annual exam he has complaints of right upper quadrant pain that is been going on for 2 days he does have history of renal stones.  Point-of-care urinalysis was normal.  He still complains of toenail fungus he was unable to afford the medication we gave his co-pay  is too high with his current insurance.  He has no nausea or vomiting there is no blood in the urine no blood in the stool no other associated symptoms of the right upper quadrant abdominal pain.  10/31/21 Patient returns today for medication refills his abdominal pain is improved as long as he takes pantoprazole and Carafate.  He does have Barrett's esophagus and needs follow-up with gastroenterology.  Toenail situation is improved with topical care.   Blood pressure on arrival excellent 137/84.  No recurrence of any kidney stones.  No other complaints.  12/26 Past Medical History:  Diagnosis Date   Abscess of abdominal wall 07/09/2013   ADHD (attention deficit hyperactivity disorder)    Pollen allergies     Past Surgical History:  Procedure Laterality Date   INCISION AND DRAINAGE ABSCESS N/A 06/06/2013   Procedure: INCISION AND DRAINAGE DEBRIDEMENT OF ABDOMINAL WALL;  Surgeon: Imogene Burn. Georgette Dover, MD;  Location: [facility];  Service: General;  Laterality: N/A;    Family History  Problem Relation Age of Onset   Hypertension Mother    Diabetes Mother    Diabetes Mellitus II Father    Dementia Father    Heart disease Father    Hypertension Father    Hyperlipidemia Father    COPD Father    Hypertension Maternal Grandmother    Hypertension Maternal Grandfather     Social History   Socioeconomic History   Marital status: Single    Spouse name: Not on file   Number of children: 0   Years of education: Not on file   Highest education level: Not on file  Occupational History   Occupation: landscaping/lawn care  Tobacco Use   Smoking status: Never   Smokeless tobacco: Never  Vaping Use   Vaping Use: Never used  Substance and Sexual Activity   Alcohol use: Yes    Comment: occasional   Drug use: No   Sexual activity: Not on file  Other Topics Concern   Not on file  Social History Narrative   Not on file   Social Determinants of Health   Financial Resource Strain: Not on file  Food Insecurity: Not on file  Transportation Needs: Not on file  Physical Activity: Not on file  Stress: Not on file  Social Connections: Not on file  Intimate Partner Violence: Not on file    Outpatient Medications Prior to Visit  Medication Sig Dispense Refill   dicyclomine (BENTYL) 20 MG tablet Take 1 tablet (20 mg total) by mouth every 6 (six) hours as needed for spasms. 40 tablet 0   Multiple Vitamin (MULTIVITAMIN) tablet Take 1 tablet by  mouth daily.     pantoprazole (PROTONIX) 40 MG tablet Take 1 tablet (40 mg total) by mouth daily before breakfast. 60 tablet 4   sucralfate (CARAFATE) 1 g tablet Take 1 tablet (1 g total) by mouth daily. 60 tablet 4   No facility-administered medications prior to visit.    No Known Allergies  ROS Review of Systems  Constitutional: Negative.  Negative for chills, diaphoresis and fever.  HENT: Negative.  Negative for congestion, ear pain, hearing loss, nosebleeds, postnasal drip, rhinorrhea, sinus pressure, sore throat, tinnitus, trouble swallowing and voice change.   Eyes: Negative.  Negative for photophobia and redness.  Respiratory: Negative.  Negative for apnea, cough, choking, chest tightness, shortness of breath, wheezing and stridor.   Cardiovascular: Negative.  Negative for chest pain, palpitations and leg swelling.  Gastrointestinal:  Negative for abdominal distention, abdominal pain,  anal bleeding, blood in stool, constipation, diarrhea, nausea, rectal pain and vomiting.  Endocrine: Negative for polydipsia.  Genitourinary: Negative.  Negative for decreased urine volume, dysuria, enuresis, flank pain, frequency, genital sores, hematuria, penile discharge, penile pain, penile swelling, scrotal swelling, testicular pain and urgency.  Musculoskeletal: Negative.  Negative for arthralgias, back pain, myalgias and neck pain.  Skin: Negative.  Negative for rash.       Toenail fungus in all toes bilaterally  Allergic/Immunologic: Negative.  Negative for environmental allergies and food allergies.  Neurological: Negative.  Negative for dizziness, tremors, seizures, syncope, weakness and headaches.  Hematological: Negative.  Negative for adenopathy. Does not bruise/bleed easily.  Psychiatric/Behavioral: Negative.  Negative for agitation, sleep disturbance and suicidal ideas. The patient is not nervous/anxious.       Objective:    Physical Exam Vitals reviewed.  Constitutional:       Appearance: Normal appearance. He is well-developed. He is not diaphoretic.  HENT:     Head: Normocephalic and atraumatic.     Nose: No nasal deformity, septal deviation, mucosal edema or rhinorrhea.     Right Sinus: No maxillary sinus tenderness or frontal sinus tenderness.     Left Sinus: No maxillary sinus tenderness or frontal sinus tenderness.     Mouth/Throat:     Pharynx: No oropharyngeal exudate.  Eyes:     General: No scleral icterus.    Conjunctiva/sclera: Conjunctivae normal.     Pupils: Pupils are equal, round, and reactive to light.  Neck:     Thyroid: No thyromegaly.     Vascular: No carotid bruit or JVD.     Trachea: Trachea normal. No tracheal tenderness or tracheal deviation.  Cardiovascular:     Rate and Rhythm: Normal rate and regular rhythm.     Chest Wall: PMI is not displaced.     Pulses: Normal pulses. No decreased pulses.     Heart sounds: Normal heart sounds, S1 normal and S2 normal. Heart sounds not distant. No murmur heard.    No systolic murmur is present.     No diastolic murmur is present.     No friction rub. No gallop. No S3 or S4 sounds.  Pulmonary:     Effort: No tachypnea, accessory muscle usage or respiratory distress.     Breath sounds: No stridor. No decreased breath sounds, wheezing, rhonchi or rales.  Chest:     Chest wall: No tenderness.  Abdominal:     General: Abdomen is flat. Bowel sounds are normal. There is no distension.     Palpations: Abdomen is soft. Abdomen is not rigid. There is no mass.     Tenderness: There is no abdominal tenderness. There is no right CVA tenderness, left CVA tenderness, guarding or rebound.     Hernia: No hernia is present.  Genitourinary:    Penis: Normal.      Testes: Normal.     Prostate: Normal.     Rectum: Normal.  Musculoskeletal:        General: Normal range of motion.     Cervical back: Normal range of motion and neck supple. No edema, erythema or rigidity. No muscular tenderness. Normal range of  motion.  Lymphadenopathy:     Head:     Right side of head: No submental or submandibular adenopathy.     Left side of head: No submental or submandibular adenopathy.     Cervical: No cervical adenopathy.  Skin:    General: Skin is warm and dry.  Coloration: Skin is not jaundiced or pale.     Findings: No bruising, erythema, lesion or rash.     Nails: There is no clubbing.     Comments: Toenail fungus bilaterally all toes  Skin is thoroughly examined from head to feet with no evidence of melanoma seen only benign nevi and seborrheic keratosis  Neurological:     Mental Status: He is alert and oriented to person, place, and time.     Sensory: No sensory deficit.  Psychiatric:        Mood and Affect: Mood normal.        Speech: Speech normal.        Behavior: Behavior normal.        Thought Content: Thought content normal.        Judgment: Judgment normal.     There were no vitals taken for this visit. Wt Readings from Last 3 Encounters:  10/31/21 166 lb 12.8 oz (75.7 kg)  05/30/21 176 lb (79.8 kg)  01/30/21 163 lb 6.4 oz (74.1 kg)     Health Maintenance Due  Topic Date Due   DTaP/Tdap/Td (1 - Tdap) Never done   INFLUENZA VACCINE  Never done     There are no preventive care reminders to display for this patient.  Lab Results  Component Value Date   TSH 1.830 05/30/2021   Lab Results  Component Value Date   WBC 7.7 05/30/2021   HGB 13.7 05/30/2021   HCT 42.1 05/30/2021   MCV 83 05/30/2021   PLT 250 05/30/2021   Lab Results  Component Value Date   NA 140 05/30/2021   K 4.5 05/30/2021   CO2 25 05/30/2021   GLUCOSE 88 05/30/2021   BUN 11 05/30/2021   CREATININE 0.90 05/30/2021   BILITOT 0.3 05/30/2021   ALKPHOS 93 05/30/2021   AST 22 05/30/2021   ALT 24 05/30/2021   PROT 7.0 05/30/2021   ALBUMIN 4.4 05/30/2021   CALCIUM 8.8 05/30/2021   ANIONGAP 8 10/12/2014   EGFR 103 05/30/2021   Lab Results  Component Value Date   CHOL 192 05/30/2021   Lab  Results  Component Value Date   HDL 54 05/30/2021   Lab Results  Component Value Date   LDLCALC 126 (H) 05/30/2021   Lab Results  Component Value Date   TRIG 62 05/30/2021   Lab Results  Component Value Date   CHOLHDL 3.6 05/30/2021   Lab Results  Component Value Date   HGBA1C 5.6 09/26/2020  Urine dipstick shows negative for all components.  Micro exam: not done.     Assessment & Plan:   Problem List Items Addressed This Visit   None No orders of the defined types were placed in this encounter.   Follow-up: No follow-ups on file.    Asencion Noble, MD

## 2022-03-05 ENCOUNTER — Encounter: Payer: Self-pay | Admitting: Critical Care Medicine

## 2022-03-05 ENCOUNTER — Ambulatory Visit: Payer: 59 | Attending: Critical Care Medicine | Admitting: Critical Care Medicine

## 2022-03-05 VITALS — BP 125/75 | HR 87 | Temp 98.4°F | Ht 65.0 in | Wt 172.0 lb

## 2022-03-05 DIAGNOSIS — R030 Elevated blood-pressure reading, without diagnosis of hypertension: Secondary | ICD-10-CM | POA: Diagnosis not present

## 2022-03-05 DIAGNOSIS — K22710 Barrett's esophagus with low grade dysplasia: Secondary | ICD-10-CM | POA: Diagnosis not present

## 2022-03-05 MED ORDER — DICYCLOMINE HCL 20 MG PO TABS: 20.0000 mg | ORAL_TABLET | Freq: Four times a day (QID) | ORAL | 0 refills | Status: DC | PRN

## 2022-03-05 MED ORDER — PANTOPRAZOLE SODIUM 40 MG PO TBEC: 40.0000 mg | DELAYED_RELEASE_TABLET | Freq: Every day | ORAL | 4 refills | Status: DC

## 2022-03-05 MED ORDER — SUCRALFATE 1 G PO TABS: 1.0000 g | ORAL_TABLET | Freq: Every day | ORAL | 4 refills | Status: DC

## 2022-03-05 NOTE — Patient Instructions (Signed)
Refills on all medications sent to your pharmacy  Please call gastroenterology at [phone] to get an appointment with Dr. Lyndel Safe for your stomach  Follow lifestyle medicine approach with improved dietary choices to improve your stomach health  Give consideration for obtaining a dental cleaning at least once a year with a dental hygienist  Return to Dr. Joya Gaskins 6 months  Keep blood pressure measurements ongoing at least twice a week call us if you run into greater than 140/80

## 2022-03-05 NOTE — Assessment & Plan Note (Signed)
Blood pressure is normal at home readings we will monitor off medications and I gave him lifestyle medicine handout The following Lifestyle Medicine recommendations according to [facility]) were discussed and offered to patient who agrees to start the journey:  A. Whole Foods, Plant-based plate comprising of fruits and vegetables, plant-based proteins, whole-grain carbohydrates was discussed in detail with the patient.   A list for source of those nutrients were also provided to the patient.  Patient will use only water or unsweetened tea for hydration. B.  The need to stay away from risky substances including alcohol, smoking; obtaining 7 to 9 hours of restorative sleep, at least 150 minutes of moderate intensity exercise weekly, the importance of healthy social connections,  and stress reduction techniques were discussed.

## 2022-03-05 NOTE — Assessment & Plan Note (Signed)
Patient needs repeat endoscopy I have given him the number to call for gastroenterology to have the referral continue pantoprazole and Carafate

## 2022-04-18 ENCOUNTER — Other Ambulatory Visit: Payer: Self-pay | Admitting: Pharmacist

## 2022-04-18 NOTE — Progress Notes (Signed)
Patient seen by Junius Finner, PharmD Candidate on 04/18/2022 while they were picking up prescriptions at [place] [facility].   Blood pressure today was : 148/82, HR 77; on recheck: 135/85, HR 78.    Patient has an automated home blood pressure machine. They report use at home PRN.    Medication review was performed. They are taking medications as prescribed.    The following barriers to adherence were noted:  - They do not have cost concerns.  - They do not have transportation concerns.  - They do not need assistance obtaining refills.  - They do not occasionally forget to take some of their prescribed medications.  - They do not feel like one/some of their medications make them feel poorly.  - They do not have questions or concerns about their medications.  - They do have follow up scheduled with their primary care provider/cardiologist.    The following interventions were completed:  - Medications were reviewed  - Patient was educated on proper technique to check home blood pressure and reminded to bring home machine and readings to next provider appointment. Patient was given a blood pressure log.  - Patient was counseled on lifestyle modifications to improve blood pressure, including decreasing salt intake.    The patient has follow up scheduled:  PCP: Dr. Joya Gaskins on 09/04/2022 at 9:50 am    [place]  PharmD Candidate [phone]

## 2022-04-19 ENCOUNTER — Telehealth: Payer: Self-pay | Admitting: Pharmacist

## 2022-04-19 MED ORDER — AMLODIPINE BESYLATE 5 MG PO TABS: 5.0000 mg | ORAL_TABLET | Freq: Every day | ORAL | 1 refills | Status: DC

## 2022-04-19 NOTE — Telephone Encounter (Signed)
Contacted patient to discuss initiation of amlodipine; left voicemail for him to return my call at his convenience.   Catie Hedwig Morton, PharmD, [place], [facility] [phone]

## 2022-04-19 NOTE — Addendum Note (Signed)
Addended by: Elsie Stain on: 04/19/2022 06:15 AM   Modules accepted: Orders

## 2022-04-19 NOTE — Progress Notes (Signed)
I wish the patient to start amlodipine ,  I think his bp trend is too high  Catie can you call this patient to confirm  I sent it to his [facility]  and I will move his next appt up sooner with me

## 2022-04-22 ENCOUNTER — Other Ambulatory Visit: Payer: 59 | Admitting: Pharmacist

## 2022-04-22 NOTE — Telephone Encounter (Signed)
Contacted patient to discuss amlodipine. He asked that I call back around 1-2 pm.   Catie Hedwig Morton, PharmD, [place], [place] [phone]

## 2022-04-22 NOTE — Progress Notes (Signed)
Attempted to contact patient for scheduled appointment for medication management. Left HIPAA compliant message for patient to return my call at their convenience.   Catie Hedwig Morton, PharmD, [place], [facility] [phone]

## 2022-05-28 NOTE — Progress Notes (Unsigned)
Established Patient Office Visit  Subjective:  Patient ID: Roberto Fisher, male    DOB: 05-22-1970  Age: 52 y.o. MRN: [id number]  CC:  No chief complaint on file.   HPI Roberto Fisher presents for annual exam on 05/30/2021 09/26/20 Roberto Fisher presents for a new patient evaluation to establish primary care. He presents with a list of minor complaints to discuss such as hemorrhoids, tinnitus, headaches and a recent urgent care visit regarding substernal chest pains. Starting in mid 2021, he will experience a retrosternal chest "pressure" when he is at home. The pain occurs randomly and will last a few hours before resolving on its own without any no known alleviating factors. He has not tried any medication for the problem and in addition to chest pain he will sometimes also experience a headache. He denies trauma, syncope or vision changes. 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 He says the pain never bothers him at night nor while at work, only bothersome when he is at home. He denies fevers or recent illness, cough or shortness of breath. He does not drink alcohol, use tobacco products or any other illicit substances. On 09/11/20 he went to urgent care because of the pain and he was told to proceed to the ER for a further cardiac work-up but he declined due to cost.    In February 2022, he underwent a screening colonoscopy and diagnostic endoscopy due to progressive esophagitis and dysphagia. Work up shows some Barrett's esophagitis and a mild hiatal hernia. The patient often has heartburn and sleeps propped up in bed. Today he denies abd pain as well as NVD. He was taking an antacid but stopped. Colonoscopy was WNL- may repeat in 10 years.    He states his only past medical history is positive for ADHD. He does not take any medicine for this and states that his "nerves" often get to him. Patient does not describe himself as an anxious or  ruminating person but appears hypervigilant about his health and is quite talkative. He denies feeling depressed, thoughts of self harm nor hallucinations.   Additionally he mentions an odd complaint regarding to intermittent tinnitus. He says he regularly cleans his ears with Q tips and rinses with water. I cautioned him against using Q tips and recommended gentle flushing while in the shower.    He also mentioned he intermittently struggles with hemorrhoids. He has had them for years and will use OTC cream on an as needed basis.  He also has had a kidney stone and requests a urinalysis for his peace of mind. He denies any back pains or hematuria. No further workup was elaborated on these problems due to time constraints.   01/30/21 Patient seen in return follow-up for Barrett's esophagitis overall he is improved.  He is on Carafate daily and pantoprazole daily with improvement in abdominal and chest pain.  He still has minimal tinnitus.  He complains of toenail fungus.  On arrival blood pressure 137/87.  He follows a healthy diet.  There are no other complaints now.  Patient declined tetanus shot and flu vaccine he is giving consideration shingles vaccination  05/30/21 Patient presents for an annual exam he has complaints of right upper quadrant pain that is been going on for 2 days he does have history of renal stones.  Point-of-care urinalysis was normal.  He still complains of toenail fungus he was unable to afford the medication we gave his co-pay  is too high with his current insurance.  He has no nausea or vomiting there is no blood in the urine no blood in the stool no other associated symptoms of the right upper quadrant abdominal pain.  10/31/21 Patient returns today for medication refills his abdominal pain is improved as long as he takes pantoprazole and Carafate.  He does have Barrett's esophagus and needs follow-up with gastroenterology.  Toenail situation is improved with topical care.   Blood pressure on arrival excellent 137/84.  No recurrence of any kidney stones.  No other complaints.  12/26 Patient seen in return follow-up still having abdominal pain is yet to see gastroenterology when checking on the referral from August they have been trying to reach him and not able to get him scheduled.  He is still taking pantoprazole and Carafate which help with his symptom complex along with Bentyl.  On arrival blood pressure is elevated but his home readings are much lower 125/75.  He is not on blood pressure medications at this time.  He would like to have his ears examined at this visit.  He is interested in lung cancer screening because he was exposed to some passive smoke from the age of 35 months old till 52 years of age from his father's heavy smoking.  There is no lung cancer history in his family  05/29/22   Past Medical History:  Diagnosis Date   Abscess of abdominal wall 07/09/2013   ADHD (attention deficit hyperactivity disorder)    Pollen allergies     Past Surgical History:  Procedure Laterality Date   INCISION AND DRAINAGE ABSCESS N/A 06/06/2013   Procedure: INCISION AND DRAINAGE DEBRIDEMENT OF ABDOMINAL WALL;  Surgeon: Imogene Burn. Georgette Dover, MD;  Location: [facility] OR;  Service: General;  Laterality: N/A;    Family History  Problem Relation Age of Onset   Hypertension Mother    Diabetes Mother    Diabetes Mellitus II Father    Dementia Father    Heart disease Father    Hypertension Father    Hyperlipidemia Father    COPD Father    Hypertension Maternal Grandmother    Hypertension Maternal Grandfather     Social History   Socioeconomic History   Marital status: Single    Spouse name: Not on file   Number of children: 0   Years of education: Not on file   Highest education level: Not on file  Occupational History   Occupation: landscaping/lawn care  Tobacco Use   Smoking status: Never   Smokeless tobacco: Never  Vaping Use   Vaping Use: Never used   Substance and Sexual Activity   Alcohol use: Yes    Comment: occasional   Drug use: No   Sexual activity: Not on file  Other Topics Concern   Not on file  Social History Narrative   Not on file   Social Determinants of Health   Financial Resource Strain: Not on file  Food Insecurity: Not on file  Transportation Needs: Not on file  Physical Activity: Not on file  Stress: Not on file  Social Connections: Not on file  Intimate Partner Violence: Not on file    Outpatient Medications Prior to Visit  Medication Sig Dispense Refill   amLODipine (NORVASC) 5 MG tablet Take 1 tablet (5 mg total) by mouth daily. 60 tablet 1   dicyclomine (BENTYL) 20 MG tablet Take 1 tablet (20 mg total) by mouth every 6 (six) hours as needed for spasms. 40 tablet 0  Multiple Vitamin (MULTIVITAMIN) tablet Take 1 tablet by mouth daily.     pantoprazole (PROTONIX) 40 MG tablet Take 1 tablet (40 mg total) by mouth daily before breakfast. 60 tablet 4   sucralfate (CARAFATE) 1 g tablet Take 1 tablet (1 g total) by mouth daily. 60 tablet 4   No facility-administered medications prior to visit.    No Known Allergies  ROS Review of Systems  Constitutional: Negative.  Negative for chills, diaphoresis and fever.  HENT: Negative.  Negative for congestion, ear pain, hearing loss, nosebleeds, postnasal drip, rhinorrhea, sinus pressure, sore throat, tinnitus, trouble swallowing and voice change.   Eyes: Negative.  Negative for photophobia and redness.  Respiratory: Negative.  Negative for apnea, cough, choking, chest tightness, shortness of breath, wheezing and stridor.   Cardiovascular: Negative.  Negative for chest pain, palpitations and leg swelling.  Gastrointestinal:  Positive for abdominal pain. Negative for abdominal distention, anal bleeding, blood in stool, constipation, diarrhea, nausea, rectal pain and vomiting.  Endocrine: Negative for polydipsia.  Genitourinary: Negative.  Negative for decreased  urine volume, dysuria, enuresis, flank pain, frequency, genital sores, hematuria, penile discharge, penile pain, penile swelling, scrotal swelling, testicular pain and urgency.  Musculoskeletal: Negative.  Negative for arthralgias, back pain, myalgias and neck pain.  Skin: Negative.  Negative for rash.       Toenail fungus in all toes bilaterally  Allergic/Immunologic: Negative.  Negative for environmental allergies and food allergies.  Neurological: Negative.  Negative for dizziness, tremors, seizures, syncope, weakness and headaches.  Hematological: Negative.  Negative for adenopathy. Does not bruise/bleed easily.  Psychiatric/Behavioral: Negative.  Negative for agitation, sleep disturbance and suicidal ideas. The patient is not nervous/anxious.       Objective:    Physical Exam Vitals reviewed.  Constitutional:      Appearance: Normal appearance. He is well-developed. He is not diaphoretic.  HENT:     Head: Normocephalic and atraumatic.     Right Ear: Tympanic membrane, ear canal and external ear normal. There is no impacted cerumen.     Left Ear: Tympanic membrane, ear canal and external ear normal. There is no impacted cerumen.     Nose: No nasal deformity, septal deviation, mucosal edema or rhinorrhea.     Right Sinus: No maxillary sinus tenderness or frontal sinus tenderness.     Left Sinus: No maxillary sinus tenderness or frontal sinus tenderness.     Mouth/Throat:     Pharynx: No oropharyngeal exudate.  Eyes:     General: No scleral icterus.    Conjunctiva/sclera: Conjunctivae normal.     Pupils: Pupils are equal, round, and reactive to light.  Neck:     Thyroid: No thyromegaly.     Vascular: No carotid bruit or JVD.     Trachea: Trachea normal. No tracheal tenderness or tracheal deviation.  Cardiovascular:     Rate and Rhythm: Normal rate and regular rhythm.     Chest Wall: PMI is not displaced.     Pulses: Normal pulses. No decreased pulses.     Heart sounds: Normal  heart sounds, S1 normal and S2 normal. Heart sounds not distant. No murmur heard.    No systolic murmur is present.     No diastolic murmur is present.     No friction rub. No gallop. No S3 or S4 sounds.  Pulmonary:     Effort: No tachypnea, accessory muscle usage or respiratory distress.     Breath sounds: No stridor. No decreased breath sounds, wheezing, rhonchi or  rales.  Chest:     Chest wall: No tenderness.  Abdominal:     General: Abdomen is flat. Bowel sounds are normal. There is no distension.     Palpations: Abdomen is soft. Abdomen is not rigid. There is no mass.     Tenderness: There is no abdominal tenderness. There is no right CVA tenderness, left CVA tenderness, guarding or rebound.     Hernia: No hernia is present.  Genitourinary:    Penis: Normal.      Testes: Normal.     Prostate: Normal.     Rectum: Normal.  Musculoskeletal:        General: Normal range of motion.     Cervical back: Normal range of motion and neck supple. No edema, erythema or rigidity. No muscular tenderness. Normal range of motion.  Lymphadenopathy:     Head:     Right side of head: No submental or submandibular adenopathy.     Left side of head: No submental or submandibular adenopathy.     Cervical: No cervical adenopathy.  Skin:    General: Skin is warm and dry.     Coloration: Skin is not jaundiced or pale.     Findings: No bruising, erythema, lesion or rash.     Nails: There is no clubbing.     Comments: Toenail fungus bilaterally all toes  Skin is thoroughly examined from head to feet with no evidence of melanoma seen only benign nevi and seborrheic keratosis  Neurological:     Mental Status: He is alert and oriented to person, place, and time.     Sensory: No sensory deficit.  Psychiatric:        Mood and Affect: Mood normal.        Speech: Speech normal.        Behavior: Behavior normal.        Thought Content: Thought content normal.        Judgment: Judgment normal.      There were no vitals taken for this visit. Wt Readings from Last 3 Encounters:  03/05/22 172 lb (78 kg)  10/31/21 166 lb 12.8 oz (75.7 kg)  05/30/21 176 lb (79.8 kg)     Health Maintenance Due  Topic Date Due   COVID-19 Vaccine (1) Never done   DTaP/Tdap/Td (1 - Tdap) Never done     There are no preventive care reminders to display for this patient.  Lab Results  Component Value Date   TSH 1.830 05/30/2021   Lab Results  Component Value Date   WBC 7.7 05/30/2021   HGB 13.7 05/30/2021   HCT 42.1 05/30/2021   MCV 83 05/30/2021   PLT 250 05/30/2021   Lab Results  Component Value Date   NA 140 05/30/2021   K 4.5 05/30/2021   CO2 25 05/30/2021   GLUCOSE 88 05/30/2021   BUN 11 05/30/2021   CREATININE 0.90 05/30/2021   BILITOT 0.3 05/30/2021   ALKPHOS 93 05/30/2021   AST 22 05/30/2021   ALT 24 05/30/2021   PROT 7.0 05/30/2021   ALBUMIN 4.4 05/30/2021   CALCIUM 8.8 05/30/2021   ANIONGAP 8 10/12/2014   EGFR 103 05/30/2021   Lab Results  Component Value Date   CHOL 192 05/30/2021   Lab Results  Component Value Date   HDL 54 05/30/2021   Lab Results  Component Value Date   LDLCALC 126 (H) 05/30/2021   Lab Results  Component Value Date   TRIG 62 05/30/2021  Lab Results  Component Value Date   CHOLHDL 3.6 05/30/2021   Lab Results  Component Value Date   HGBA1C 5.6 09/26/2020  Urine dipstick shows negative for all components.  Micro exam: not done.     Assessment & Plan:   Problem List Items Addressed This Visit   None No orders of the defined types were placed in this encounter.   Follow-up: No follow-ups on file.    Asencion Noble, MD

## 2022-05-29 ENCOUNTER — Encounter: Payer: Self-pay | Admitting: Critical Care Medicine

## 2022-05-29 ENCOUNTER — Ambulatory Visit: Payer: Medicaid Other | Attending: Critical Care Medicine | Admitting: Critical Care Medicine

## 2022-05-29 VITALS — BP 117/68 | HR 76 | Temp 98.5°F | Ht 65.0 in | Wt 172.0 lb

## 2022-05-29 DIAGNOSIS — R160 Hepatomegaly, not elsewhere classified: Secondary | ICD-10-CM | POA: Diagnosis not present

## 2022-05-29 DIAGNOSIS — Z7722 Contact with and (suspected) exposure to environmental tobacco smoke (acute) (chronic): Secondary | ICD-10-CM | POA: Diagnosis not present

## 2022-05-29 DIAGNOSIS — R1011 Right upper quadrant pain: Secondary | ICD-10-CM | POA: Diagnosis not present

## 2022-05-29 DIAGNOSIS — R0609 Other forms of dyspnea: Secondary | ICD-10-CM | POA: Diagnosis not present

## 2022-05-29 DIAGNOSIS — I10 Essential (primary) hypertension: Secondary | ICD-10-CM | POA: Diagnosis not present

## 2022-05-29 DIAGNOSIS — K22710 Barrett's esophagus with low grade dysplasia: Secondary | ICD-10-CM | POA: Diagnosis not present

## 2022-05-29 MED ORDER — PANTOPRAZOLE SODIUM 40 MG PO TBEC: 40.0000 mg | DELAYED_RELEASE_TABLET | Freq: Every day | ORAL | 4 refills | Status: DC

## 2022-05-29 MED ORDER — SUCRALFATE 1 G PO TABS: 1.0000 g | ORAL_TABLET | Freq: Every day | ORAL | 4 refills | Status: DC

## 2022-05-29 NOTE — Assessment & Plan Note (Signed)
Obtain abdominal ultrasound refer to gastroenterology

## 2022-05-29 NOTE — Assessment & Plan Note (Signed)
As per hepatomegaly

## 2022-05-29 NOTE — Patient Instructions (Signed)
Chest xray will be obtained Ultrasound of abdomen will be ordered Ref to gastroenterology made No change in medications Return Dr Joya Gaskins 4 months

## 2022-05-29 NOTE — Assessment & Plan Note (Signed)
Obtain chest x ray

## 2022-05-29 NOTE — Assessment & Plan Note (Signed)
Continue proton pump inhibitor and Carafate refer back to gastroenterology

## 2022-05-29 NOTE — Assessment & Plan Note (Signed)
As per passive smoke exposure

## 2022-05-29 NOTE — Assessment & Plan Note (Signed)
Improved blood pressure on low-dose amlodipine no changes made

## 2022-09-04 ENCOUNTER — Ambulatory Visit: Payer: Self-pay | Admitting: Critical Care Medicine

## 2022-10-02 ENCOUNTER — Ambulatory Visit: Payer: BLUE CROSS/BLUE SHIELD | Attending: Critical Care Medicine | Admitting: Critical Care Medicine

## 2022-10-02 ENCOUNTER — Encounter: Payer: Self-pay | Admitting: Critical Care Medicine

## 2022-10-02 VITALS — BP 142/76 | HR 76 | Wt 164.8 lb

## 2022-10-02 DIAGNOSIS — K22710 Barrett's esophagus with low grade dysplasia: Secondary | ICD-10-CM | POA: Diagnosis not present

## 2022-10-02 DIAGNOSIS — R160 Hepatomegaly, not elsewhere classified: Secondary | ICD-10-CM

## 2022-10-02 DIAGNOSIS — Z7722 Contact with and (suspected) exposure to environmental tobacco smoke (acute) (chronic): Secondary | ICD-10-CM | POA: Diagnosis not present

## 2022-10-02 DIAGNOSIS — R0609 Other forms of dyspnea: Secondary | ICD-10-CM

## 2022-10-02 DIAGNOSIS — E782 Mixed hyperlipidemia: Secondary | ICD-10-CM

## 2022-10-02 DIAGNOSIS — I10 Essential (primary) hypertension: Secondary | ICD-10-CM

## 2022-10-02 MED ORDER — SUCRALFATE 1 G PO TABS: 1.0000 g | ORAL_TABLET | Freq: Every day | ORAL | 4 refills | Status: DC

## 2022-10-02 MED ORDER — PANTOPRAZOLE SODIUM 40 MG PO TBEC: 40.0000 mg | DELAYED_RELEASE_TABLET | Freq: Every day | ORAL | 4 refills | Status: DC

## 2022-10-02 MED ORDER — DICYCLOMINE HCL 20 MG PO TABS: 20.0000 mg | ORAL_TABLET | Freq: Four times a day (QID) | ORAL | 0 refills | Status: DC | PRN

## 2022-10-02 MED ORDER — AMLODIPINE BESYLATE 5 MG PO TABS: 5.0000 mg | ORAL_TABLET | Freq: Every day | ORAL | 3 refills | Status: DC

## 2022-10-02 NOTE — Assessment & Plan Note (Signed)
Reorder chest x-ray

## 2022-10-02 NOTE — Assessment & Plan Note (Signed)
Patient never received ultrasound of abdomen will reorder

## 2022-10-02 NOTE — Assessment & Plan Note (Signed)
Yet another referral sent to gastroenterology continue Protonix and Carafate

## 2022-10-02 NOTE — Patient Instructions (Signed)
Resume amlodipine 1 daily All other medications were refilled  Labs will be done today  Referral back to gastroenterology will be made  Order for ultrasound of abdomen and chest x-ray was placed  Follow a healthy diet as noted below  Return for nurse blood pressure check in 3 weeks  Return here to the clinic 2 months

## 2022-10-02 NOTE — Assessment & Plan Note (Addendum)
Not controlled due to not taking medication will refill amlodipine 10 mg daily bring patient back short-term follow-up for blood pressure recheck and assess metabolic panel

## 2022-10-02 NOTE — Progress Notes (Signed)
Established Patient Office Visit  Subjective:  Patient ID: Roberto Fisher, male    DOB: 1970/11/19  Age: 52 y.o. MRN: [id number]  CC:  Chief Complaint  Patient presents with   Hypertension   Medication Refill    HPI Roberto Fisher presents primary care follow-up  09/26/20 Roberto Fisher presents for a new patient evaluation to establish primary care. He presents with a list of minor complaints to discuss such as hemorrhoids, tinnitus, headaches and a recent urgent care visit regarding substernal chest pains. Starting in mid 2021, he will experience a retrosternal chest "pressure" when he is at home. The pain occurs randomly and will last a few hours before resolving on its own without any no known alleviating factors. He has not tried any medication for the problem and in addition to chest pain he will sometimes also experience a headache. He denies trauma, syncope or vision changes. 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 He says the pain never bothers him at night nor while at work, only bothersome when he is at home. He denies fevers or recent illness, cough or shortness of breath. He does not drink alcohol, use tobacco products or any other illicit substances. On 09/11/20 he went to urgent care because of the pain and he was told to proceed to the ER for a further cardiac work-up but he declined due to cost.    In February 2022, he underwent a screening colonoscopy and diagnostic endoscopy due to progressive esophagitis and dysphagia. Work up shows some Barrett's esophagitis and a mild hiatal hernia. The patient often has heartburn and sleeps propped up in bed. Today he denies abd pain as well as NVD. He was taking an antacid but stopped. Colonoscopy was WNL- may repeat in 10 years.    He states his only past medical history is positive for ADHD. He does not take any medicine for this and states that his "nerves" often get to him. Patient  does not describe himself as an anxious or ruminating person but appears hypervigilant about his health and is quite talkative. He denies feeling depressed, thoughts of self harm nor hallucinations.   Additionally he mentions an odd complaint regarding to intermittent tinnitus. He says he regularly cleans his ears with Q tips and rinses with water. I cautioned him against using Q tips and recommended gentle flushing while in the shower.    He also mentioned he intermittently struggles with hemorrhoids. He has had them for years and will use OTC cream on an as needed basis.  He also has had a kidney stone and requests a urinalysis for his peace of mind. He denies any back pains or hematuria. No further workup was elaborated on these problems due to time constraints.   01/30/21 Patient seen in return follow-up for Barrett's esophagitis overall he is improved.  He is on Carafate daily and pantoprazole daily with improvement in abdominal and chest pain.  He still has minimal tinnitus.  He complains of toenail fungus.  On arrival blood pressure 137/87.  He follows a healthy diet.  There are no other complaints now.  Patient declined tetanus shot and flu vaccine he is giving consideration shingles vaccination  05/30/21 Patient presents for an annual exam he has complaints of right upper quadrant pain that is been going on for 2 days he does have history of renal stones.  Point-of-care urinalysis was normal.  He still complains of toenail fungus he was unable  to afford the medication we gave his co-pay is too high with his current insurance.  He has no nausea or vomiting there is no blood in the urine no blood in the stool no other associated symptoms of the right upper quadrant abdominal pain.  10/31/21 Patient returns today for medication refills his abdominal pain is improved as long as he takes pantoprazole and Carafate.  He does have Barrett's esophagus and needs follow-up with gastroenterology.   Toenail situation is improved with topical care.  Blood pressure on arrival excellent 137/84.  No recurrence of any kidney stones.  No other complaints.  12/26 Patient seen in return follow-up still having abdominal pain is yet to see gastroenterology when checking on the referral from August they have been trying to reach him and not able to get him scheduled.  He is still taking pantoprazole and Carafate which help with his symptom complex along with Bentyl.  On arrival blood pressure is elevated but his home readings are much lower 125/75.  He is not on blood pressure medications at this time.  He would like to have his ears examined at this visit.  He is interested in lung cancer screening because he was exposed to some passive smoke from the age of 69 months old till 52 years of age from his father's heavy smoking.  There is no lung cancer history in his family  05/29/22 Patient seen in return follow-up he has Barrett's esophagitis did not return with gastroenterology he had no insurance now he has Medicaid.  Still has right lower quadrant pain was not able to get the abdominal ultrasound last year.  He is concerned about emphysema as he is dyspneic on exertion he was exposed to passive smoke exposure with his father he has not directly smoked.  Note he does not drink alcohol.  History of hypertension blood pressure is 117/68 he stays on the amlodipine 10 mg daily maintains Protonix and Carafate for gastroenteritis and Barrett's esophagitis reflux  7/24 Patient returns in follow-up for blood pressure assessment history of Barrett's esophagus unfortunately he stopped his blood pressure medicine which was amlodipine 10 mg a day he also missed his gastroenterology appointments Past Medical History:  Diagnosis Date   Abscess of abdominal wall 07/09/2013   ADHD (attention deficit hyperactivity disorder)    Pollen allergies     Past Surgical History:  Procedure Laterality Date   INCISION AND DRAINAGE  ABSCESS N/A 06/06/2013   Procedure: INCISION AND DRAINAGE DEBRIDEMENT OF ABDOMINAL WALL;  Surgeon: Wilmon Arms. Corliss Skains, MD;  Location: [facility] OR;  Service: General;  Laterality: N/A;    Family History  Problem Relation Age of Onset   Hypertension Mother    Diabetes Mother    Diabetes Mellitus II Father    Dementia Father    Heart disease Father    Hypertension Father    Hyperlipidemia Father    COPD Father    Hypertension Maternal Grandmother    Hypertension Maternal Grandfather     Social History   Socioeconomic History   Marital status: Single    Spouse name: Not on file   Number of children: 0   Years of education: Not on file   Highest education level: Not on file  Occupational History   Occupation: landscaping/lawn care  Tobacco Use   Smoking status: Never   Smokeless tobacco: Never  Vaping Use   Vaping status: Never Used  Substance and Sexual Activity   Alcohol use: Yes    Comment: occasional  Drug use: No   Sexual activity: Not on file  Other Topics Concern   Not on file  Social History Narrative   Not on file   Social Determinants of Health   Financial Resource Strain: Not on file  Food Insecurity: Not on file  Transportation Needs: Not on file  Physical Activity: Not on file  Stress: Not on file  Social Connections: Not on file  Intimate Partner Violence: Unknown (06/15/2021)   Received from [facility]   HITS    Physically Hurt: Not on file    Insult or Talk Down To: Not on file    Threaten Physical Harm: Not on file    Scream or Curse: Not on file    Outpatient Medications Prior to Visit  Medication Sig Dispense Refill   Multiple Vitamin (MULTIVITAMIN) tablet Take 1 tablet by mouth daily.     amLODipine (NORVASC) 5 MG tablet Take 1 tablet (5 mg total) by mouth daily. 60 tablet 1   dicyclomine (BENTYL) 20 MG tablet Take 1 tablet (20 mg total) by mouth every 6 (six) hours as needed for spasms. 40 tablet 0   pantoprazole (PROTONIX) 40 MG tablet Take  1 tablet (40 mg total) by mouth daily before breakfast. 60 tablet 4   sucralfate (CARAFATE) 1 g tablet Take 1 tablet (1 g total) by mouth daily. 60 tablet 4   No facility-administered medications prior to visit.    No Known Allergies  ROS Review of Systems  Constitutional: Negative.  Negative for chills, diaphoresis and fever.  HENT: Negative.  Negative for congestion, ear pain, hearing loss, nosebleeds, postnasal drip, rhinorrhea, sinus pressure, sore throat, tinnitus, trouble swallowing and voice change.   Eyes: Negative.  Negative for photophobia and redness.  Respiratory: Negative.  Negative for apnea, cough, choking, chest tightness, shortness of breath, wheezing and stridor.   Cardiovascular: Negative.  Negative for chest pain, palpitations and leg swelling.  Gastrointestinal:  Positive for abdominal pain. Negative for abdominal distention, anal bleeding, blood in stool, constipation, diarrhea, nausea, rectal pain and vomiting.  Endocrine: Negative for polydipsia.  Genitourinary: Negative.  Negative for decreased urine volume, dysuria, enuresis, flank pain, frequency, genital sores, hematuria, penile discharge, penile pain, penile swelling, scrotal swelling, testicular pain and urgency.  Musculoskeletal: Negative.  Negative for arthralgias, back pain, myalgias and neck pain.  Skin: Negative.  Negative for rash.       Toenail fungus in all toes bilaterally  Allergic/Immunologic: Negative.  Negative for environmental allergies and food allergies.  Neurological: Negative.  Negative for dizziness, tremors, seizures, syncope, weakness and headaches.  Hematological: Negative.  Negative for adenopathy. Does not bruise/bleed easily.  Psychiatric/Behavioral: Negative.  Negative for agitation, sleep disturbance and suicidal ideas. The patient is not nervous/anxious.       Objective:    Physical Exam Vitals reviewed.  Constitutional:      Appearance: Normal appearance. He is  well-developed. He is not diaphoretic.  HENT:     Head: Normocephalic and atraumatic.     Right Ear: Tympanic membrane, ear canal and external ear normal. There is no impacted cerumen.     Left Ear: Tympanic membrane, ear canal and external ear normal. There is no impacted cerumen.     Nose: No nasal deformity, septal deviation, mucosal edema or rhinorrhea.     Right Sinus: No maxillary sinus tenderness or frontal sinus tenderness.     Left Sinus: No maxillary sinus tenderness or frontal sinus tenderness.     Mouth/Throat:  Pharynx: No oropharyngeal exudate.  Eyes:     General: No scleral icterus.    Conjunctiva/sclera: Conjunctivae normal.     Pupils: Pupils are equal, round, and reactive to light.  Neck:     Thyroid: No thyromegaly.     Vascular: No carotid bruit or JVD.     Trachea: Trachea normal. No tracheal tenderness or tracheal deviation.  Cardiovascular:     Rate and Rhythm: Normal rate and regular rhythm.     Chest Wall: PMI is not displaced.     Pulses: Normal pulses. No decreased pulses.     Heart sounds: Normal heart sounds, S1 normal and S2 normal. Heart sounds not distant. No murmur heard.    No systolic murmur is present.     No diastolic murmur is present.     No friction rub. No gallop. No S3 or S4 sounds.  Pulmonary:     Effort: No tachypnea, accessory muscle usage or respiratory distress.     Breath sounds: No stridor. No decreased breath sounds, wheezing, rhonchi or rales.  Chest:     Chest wall: No tenderness.  Abdominal:     General: Abdomen is flat. Bowel sounds are normal. There is no distension.     Palpations: Abdomen is soft. Abdomen is not rigid. There is no mass.     Tenderness: There is abdominal tenderness. There is no right CVA tenderness, left CVA tenderness, guarding or rebound.     Hernia: No hernia is present.     Comments: Tender right upper quadrant left lower quadrant no rebound or guarding liver reveals mild hepatomegaly   Genitourinary:    Penis: Normal.      Testes: Normal.     Prostate: Normal.     Rectum: Normal.  Musculoskeletal:        General: Normal range of motion.     Cervical back: Normal range of motion and neck supple. No edema, erythema or rigidity. No muscular tenderness. Normal range of motion.  Lymphadenopathy:     Head:     Right side of head: No submental or submandibular adenopathy.     Left side of head: No submental or submandibular adenopathy.     Cervical: No cervical adenopathy.  Skin:    General: Skin is warm and dry.     Coloration: Skin is not jaundiced or pale.     Findings: No bruising, erythema, lesion or rash.     Nails: There is no clubbing.     Comments: Toenail fungus bilaterally all toes  Skin is thoroughly examined from head to feet with no evidence of melanoma seen only benign nevi and seborrheic keratosis  Neurological:     Mental Status: He is alert and oriented to person, place, and time.     Sensory: No sensory deficit.  Psychiatric:        Mood and Affect: Mood normal.        Speech: Speech normal.        Behavior: Behavior normal.        Thought Content: Thought content normal.        Judgment: Judgment normal.     BP (!) 142/76 (BP Location: Left Arm, Patient Position: Sitting, Cuff Size: Normal)   Pulse 76   Wt 164 lb 12.8 oz (74.8 kg)   SpO2 92%   BMI 27.42 kg/m  Wt Readings from Last 3 Encounters:  10/02/22 164 lb 12.8 oz (74.8 kg)  05/29/22 172 lb (78 kg)  03/05/22  172 lb (78 kg)     Health Maintenance Due  Topic Date Due   COVID-19 Vaccine (1) Never done   DTaP/Tdap/Td (1 - Tdap) Never done     There are no preventive care reminders to display for this patient.  Lab Results  Component Value Date   TSH 1.830 05/30/2021   Lab Results  Component Value Date   WBC 7.7 05/30/2021   HGB 13.7 05/30/2021   HCT 42.1 05/30/2021   MCV 83 05/30/2021   PLT 250 05/30/2021   Lab Results  Component Value Date   NA 140 05/30/2021    K 4.5 05/30/2021   CO2 25 05/30/2021   GLUCOSE 88 05/30/2021   BUN 11 05/30/2021   CREATININE 0.90 05/30/2021   BILITOT 0.3 05/30/2021   ALKPHOS 93 05/30/2021   AST 22 05/30/2021   ALT 24 05/30/2021   PROT 7.0 05/30/2021   ALBUMIN 4.4 05/30/2021   CALCIUM 8.8 05/30/2021   ANIONGAP 8 10/12/2014   EGFR 103 05/30/2021   Lab Results  Component Value Date   CHOL 192 05/30/2021   Lab Results  Component Value Date   HDL 54 05/30/2021   Lab Results  Component Value Date   LDLCALC 126 (H) 05/30/2021   Lab Results  Component Value Date   TRIG 62 05/30/2021   Lab Results  Component Value Date   CHOLHDL 3.6 05/30/2021   Lab Results  Component Value Date   HGBA1C 5.6 09/26/2020       Assessment & Plan:   Problem List Items Addressed This Visit       Cardiovascular and Mediastinum   Hypertension    Not controlled due to not taking medication will refill amlodipine 10 mg daily bring patient back short-term follow-up for blood pressure recheck and assess metabolic panel      Relevant Medications   amLODipine (NORVASC) 5 MG tablet   Other Relevant Orders   Comprehensive metabolic panel   Recheck vitals     Digestive   Barrett esophagus    Yet another referral sent to gastroenterology continue Protonix and Carafate      Relevant Orders   US Abdomen Limited RUQ (LIVER/GB)   Ambulatory referral to Gastroenterology   Hepatomegalia - Primary    Patient never received ultrasound of abdomen will reorder      Relevant Orders   US Abdomen Limited RUQ (LIVER/GB)     Other   Dyspnea on exertion    Reorder chest x-ray      Relevant Orders   DG Chest 2 View   Passive smoke exposure   Relevant Orders   DG Chest 2 View   Other Visit Diagnoses     Mixed hyperlipidemia       Relevant Medications   amLODipine (NORVASC) 5 MG tablet   Other Relevant Orders   Lipid panel       Meds ordered this encounter  Medications   sucralfate (CARAFATE) 1 g tablet     Sig: Take 1 tablet (1 g total) by mouth daily.    Dispense:  120 tablet    Refill:  4   pantoprazole (PROTONIX) 40 MG tablet    Sig: Take 1 tablet (40 mg total) by mouth daily before breakfast.    Dispense:  90 tablet    Refill:  4   dicyclomine (BENTYL) 20 MG tablet    Sig: Take 1 tablet (20 mg total) by mouth every 6 (six) hours as needed for spasms.  Dispense:  40 tablet    Refill:  0   amLODipine (NORVASC) 5 MG tablet    Sig: Take 1 tablet (5 mg total) by mouth daily.    Dispense:  90 tablet    Refill:  3  38 minutes spent multisystems reviewed high degree of complexity  Follow-up: Return in about 2 months (around 12/03/2022) for htn, followup.    Shan Levans, MD HPI

## 2022-10-03 LAB — LIPID PANEL
Chol/HDL Ratio: 3.6 ratio (ref 0.0–5.0)
Cholesterol, Total: 178 mg/dL (ref 100–199)
HDL: 50 mg/dL (ref >39)
LDL Chol Calc (NIH): 115 mg/dL — ABNORMAL HIGH (ref 0–99)
Triglycerides: 66 mg/dL (ref 0–149)

## 2022-10-03 LAB — COMPREHENSIVE METABOLIC PANEL
ALT: 14 IU/L (ref 0–44)
AST: 20 IU/L (ref 0–40)
Albumin: 4.1 g/dL (ref 3.8–4.9)
Alkaline Phosphatase: 79 IU/L (ref 44–121)
BUN/Creatinine Ratio: 13 (ref 9–20)
BUN: 12 mg/dL (ref 6–24)
Bilirubin Total: 0.5 mg/dL (ref 0.0–1.2)
CO2: 23 mmol/L (ref 20–29)
Chloride: 106 mmol/L (ref 96–106)
Creatinine, Ser: 0.95 mg/dL (ref 0.76–1.27)
Globulin, Total: 2.8 g/dL (ref 1.5–4.5)
Potassium: 3.7 mmol/L (ref 3.5–5.2)
Sodium: 143 mmol/L (ref 134–144)
Total Protein: 6.9 g/dL (ref 6.0–8.5)
eGFR: 95 mL/min/{1.73_m2} (ref >59)

## 2022-10-03 NOTE — Progress Notes (Signed)
Let pt know liver kidney normal  no diabetes

## 2022-10-04 ENCOUNTER — Telehealth: Payer: Self-pay

## 2022-10-04 NOTE — Telephone Encounter (Signed)
Pt was called and vm was left, Information has been sent to nurse pool.   

## 2022-10-04 NOTE — Telephone Encounter (Signed)
-----   Message from Shan Levans sent at 10/03/2022 11:57 AM EDT ----- Let pt know liver kidney normal  no diabetes

## 2022-10-15 ENCOUNTER — Ambulatory Visit: Payer: BLUE CROSS/BLUE SHIELD

## 2022-12-02 NOTE — Progress Notes (Unsigned)
Established Patient Office Visit  Subjective:  Patient ID: Roberto Fisher, male    DOB: 1970-11-18  Age: 52 y.o. MRN: [id number]  CC:  No chief complaint on file.   HPI Roberto Fisher presents primary care follow-up  09/26/20 Roberto Fisher presents for a new patient evaluation to establish primary care. He presents with a list of minor complaints to discuss such as hemorrhoids, tinnitus, headaches and a recent urgent care visit regarding substernal chest pains. Starting in mid 2021, he will experience a retrosternal chest "pressure" when he is at home. The pain occurs randomly and will last a few hours before resolving on its own without any no known alleviating factors. He has not tried any medication for the problem and in addition to chest pain he will sometimes also experience a headache. He denies trauma, syncope or vision changes. 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 He says the pain never bothers him at night nor while at work, only bothersome when he is at home. He denies fevers or recent illness, cough or shortness of breath. He does not drink alcohol, use tobacco products or any other illicit substances. On 09/11/20 he went to urgent care because of the pain and he was told to proceed to the ER for a further cardiac work-up but he declined due to cost.    In February 2022, he underwent a screening colonoscopy and diagnostic endoscopy due to progressive esophagitis and dysphagia. Work up shows some Barrett's esophagitis and a mild hiatal hernia. The patient often has heartburn and sleeps propped up in bed. Today he denies abd pain as well as NVD. He was taking an antacid but stopped. Colonoscopy was WNL- may repeat in 10 years.    He states his only past medical history is positive for ADHD. He does not take any medicine for this and states that his "nerves" often get to him. Patient does not describe himself as an anxious or ruminating  person but appears hypervigilant about his health and is quite talkative. He denies feeling depressed, thoughts of self harm nor hallucinations.   Additionally he mentions an odd complaint regarding to intermittent tinnitus. He says he regularly cleans his ears with Q tips and rinses with water. I cautioned him against using Q tips and recommended gentle flushing while in the shower.    He also mentioned he intermittently struggles with hemorrhoids. He has had them for years and will use OTC cream on an as needed basis.  He also has had a kidney stone and requests a urinalysis for his peace of mind. He denies any back pains or hematuria. No further workup was elaborated on these problems due to time constraints.   01/30/21 Patient seen in return follow-up for Barrett's esophagitis overall he is improved.  He is on Carafate daily and pantoprazole daily with improvement in abdominal and chest pain.  He still has minimal tinnitus.  He complains of toenail fungus.  On arrival blood pressure 137/87.  He follows a healthy diet.  There are no other complaints now.  Patient declined tetanus shot and flu vaccine he is giving consideration shingles vaccination  05/30/21 Patient presents for an annual exam he has complaints of right upper quadrant pain that is been going on for 2 days he does have history of renal stones.  Point-of-care urinalysis was normal.  He still complains of toenail fungus he was unable to afford the medication we gave his co-pay is  too high with his current insurance.  He has no nausea or vomiting there is no blood in the urine no blood in the stool no other associated symptoms of the right upper quadrant abdominal pain.  10/31/21 Patient returns today for medication refills his abdominal pain is improved as long as he takes pantoprazole and Carafate.  He does have Barrett's esophagus and needs follow-up with gastroenterology.  Toenail situation is improved with topical care.  Blood  pressure on arrival excellent 137/84.  No recurrence of any kidney stones.  No other complaints.  12/26 Patient seen in return follow-up still having abdominal pain is yet to see gastroenterology when checking on the referral from August they have been trying to reach him and not able to get him scheduled.  He is still taking pantoprazole and Carafate which help with his symptom complex along with Bentyl.  On arrival blood pressure is elevated but his home readings are much lower 125/75.  He is not on blood pressure medications at this time.  He would like to have his ears examined at this visit.  He is interested in lung cancer screening because he was exposed to some passive smoke from the age of 26 months old till 52 years of age from his father's heavy smoking.  There is no lung cancer history in his family  05/29/22 Patient seen in return follow-up he has Barrett's esophagitis did not return with gastroenterology he had no insurance now he has Medicaid.  Still has right lower quadrant pain was not able to get the abdominal ultrasound last year.  He is concerned about emphysema as he is dyspneic on exertion he was exposed to passive smoke exposure with his father he has not directly smoked.  Note he does not drink alcohol.  History of hypertension blood pressure is 117/68 he stays on the amlodipine 10 mg daily maintains Protonix and Carafate for gastroenteritis and Barrett's esophagitis reflux  7/24 Patient returns in follow-up for blood pressure assessment history of Barrett's esophagus unfortunately he stopped his blood pressure medicine which was amlodipine 10 mg a day he also missed his gastroenterology appointments Past Medical History:  Diagnosis Date   Abscess of abdominal wall 07/09/2013   ADHD (attention deficit hyperactivity disorder)    Pollen allergies     Past Surgical History:  Procedure Laterality Date   INCISION AND DRAINAGE ABSCESS N/A 06/06/2013   Procedure: INCISION AND  DRAINAGE DEBRIDEMENT OF ABDOMINAL WALL;  Surgeon: Wilmon Arms. Corliss Skains, MD;  Location: [facility] OR;  Service: General;  Laterality: N/A;    Family History  Problem Relation Age of Onset   Hypertension Mother    Diabetes Mother    Diabetes Mellitus II Father    Dementia Father    Heart disease Father    Hypertension Father    Hyperlipidemia Father    COPD Father    Hypertension Maternal Grandmother    Hypertension Maternal Grandfather     Social History   Socioeconomic History   Marital status: Single    Spouse name: Not on file   Number of children: 0   Years of education: Not on file   Highest education level: Not on file  Occupational History   Occupation: landscaping/lawn care  Tobacco Use   Smoking status: Never   Smokeless tobacco: Never  Vaping Use   Vaping status: Never Used  Substance and Sexual Activity   Alcohol use: Yes    Comment: occasional   Drug use: No   Sexual activity:  Not on file  Other Topics Concern   Not on file  Social History Narrative   Not on file   Social Determinants of Health   Financial Resource Strain: Not on file  Food Insecurity: Not on file  Transportation Needs: Not on file  Physical Activity: Not on file  Stress: Not on file  Social Connections: Not on file  Intimate Partner Violence: Unknown (06/15/2021)   Received from [facility], [facility]   HITS    Physically Hurt: Not on file    Insult or Talk Down To: Not on file    Threaten Physical Harm: Not on file    Scream or Curse: Not on file    Outpatient Medications Prior to Visit  Medication Sig Dispense Refill   amLODipine (NORVASC) 5 MG tablet Take 1 tablet (5 mg total) by mouth daily. 90 tablet 3   dicyclomine (BENTYL) 20 MG tablet Take 1 tablet (20 mg total) by mouth every 6 (six) hours as needed for spasms. 40 tablet 0   Multiple Vitamin (MULTIVITAMIN) tablet Take 1 tablet by mouth daily.     pantoprazole (PROTONIX) 40 MG tablet Take 1 tablet (40 mg total) by mouth  daily before breakfast. 90 tablet 4   sucralfate (CARAFATE) 1 g tablet Take 1 tablet (1 g total) by mouth daily. 120 tablet 4   No facility-administered medications prior to visit.    No Known Allergies  ROS Review of Systems  Constitutional: Negative.  Negative for chills, diaphoresis and fever.  HENT: Negative.  Negative for congestion, ear pain, hearing loss, nosebleeds, postnasal drip, rhinorrhea, sinus pressure, sore throat, tinnitus, trouble swallowing and voice change.   Eyes: Negative.  Negative for photophobia and redness.  Respiratory: Negative.  Negative for apnea, cough, choking, chest tightness, shortness of breath, wheezing and stridor.   Cardiovascular: Negative.  Negative for chest pain, palpitations and leg swelling.  Gastrointestinal:  Positive for abdominal pain. Negative for abdominal distention, anal bleeding, blood in stool, constipation, diarrhea, nausea, rectal pain and vomiting.  Endocrine: Negative for polydipsia.  Genitourinary: Negative.  Negative for decreased urine volume, dysuria, enuresis, flank pain, frequency, genital sores, hematuria, penile discharge, penile pain, penile swelling, scrotal swelling, testicular pain and urgency.  Musculoskeletal: Negative.  Negative for arthralgias, back pain, myalgias and neck pain.  Skin: Negative.  Negative for rash.       Toenail fungus in all toes bilaterally  Allergic/Immunologic: Negative.  Negative for environmental allergies and food allergies.  Neurological: Negative.  Negative for dizziness, tremors, seizures, syncope, weakness and headaches.  Hematological: Negative.  Negative for adenopathy. Does not bruise/bleed easily.  Psychiatric/Behavioral: Negative.  Negative for agitation, sleep disturbance and suicidal ideas. The patient is not nervous/anxious.       Objective:    Physical Exam Vitals reviewed.  Constitutional:      Appearance: Normal appearance. He is well-developed. He is not diaphoretic.   HENT:     Head: Normocephalic and atraumatic.     Right Ear: Tympanic membrane, ear canal and external ear normal. There is no impacted cerumen.     Left Ear: Tympanic membrane, ear canal and external ear normal. There is no impacted cerumen.     Nose: No nasal deformity, septal deviation, mucosal edema or rhinorrhea.     Right Sinus: No maxillary sinus tenderness or frontal sinus tenderness.     Left Sinus: No maxillary sinus tenderness or frontal sinus tenderness.     Mouth/Throat:     Pharynx: No oropharyngeal  exudate.  Eyes:     General: No scleral icterus.    Conjunctiva/sclera: Conjunctivae normal.     Pupils: Pupils are equal, round, and reactive to light.  Neck:     Thyroid: No thyromegaly.     Vascular: No carotid bruit or JVD.     Trachea: Trachea normal. No tracheal tenderness or tracheal deviation.  Cardiovascular:     Rate and Rhythm: Normal rate and regular rhythm.     Chest Wall: PMI is not displaced.     Pulses: Normal pulses. No decreased pulses.     Heart sounds: Normal heart sounds, S1 normal and S2 normal. Heart sounds not distant. No murmur heard.    No systolic murmur is present.     No diastolic murmur is present.     No friction rub. No gallop. No S3 or S4 sounds.  Pulmonary:     Effort: No tachypnea, accessory muscle usage or respiratory distress.     Breath sounds: No stridor. No decreased breath sounds, wheezing, rhonchi or rales.  Chest:     Chest wall: No tenderness.  Abdominal:     General: Abdomen is flat. Bowel sounds are normal. There is no distension.     Palpations: Abdomen is soft. Abdomen is not rigid. There is no mass.     Tenderness: There is abdominal tenderness. There is no right CVA tenderness, left CVA tenderness, guarding or rebound.     Hernia: No hernia is present.     Comments: Tender right upper quadrant left lower quadrant no rebound or guarding liver reveals mild hepatomegaly  Genitourinary:    Penis: Normal.      Testes:  Normal.     Prostate: Normal.     Rectum: Normal.  Musculoskeletal:        General: Normal range of motion.     Cervical back: Normal range of motion and neck supple. No edema, erythema or rigidity. No muscular tenderness. Normal range of motion.  Lymphadenopathy:     Head:     Right side of head: No submental or submandibular adenopathy.     Left side of head: No submental or submandibular adenopathy.     Cervical: No cervical adenopathy.  Skin:    General: Skin is warm and dry.     Coloration: Skin is not jaundiced or pale.     Findings: No bruising, erythema, lesion or rash.     Nails: There is no clubbing.     Comments: Toenail fungus bilaterally all toes  Skin is thoroughly examined from head to feet with no evidence of melanoma seen only benign nevi and seborrheic keratosis  Neurological:     Mental Status: He is alert and oriented to person, place, and time.     Sensory: No sensory deficit.  Psychiatric:        Mood and Affect: Mood normal.        Speech: Speech normal.        Behavior: Behavior normal.        Thought Content: Thought content normal.        Judgment: Judgment normal.     There were no vitals taken for this visit. Wt Readings from Last 3 Encounters:  10/02/22 164 lb 12.8 oz (74.8 kg)  05/29/22 172 lb (78 kg)  03/05/22 172 lb (78 kg)     Health Maintenance Due  Topic Date Due   COVID-19 Vaccine (1) Never done   DTaP/Tdap/Td (1 - Tdap) Never done  INFLUENZA VACCINE  Never done     There are no preventive care reminders to display for this patient.  Lab Results  Component Value Date   TSH 1.830 05/30/2021   Lab Results  Component Value Date   WBC 7.7 05/30/2021   HGB 13.7 05/30/2021   HCT 42.1 05/30/2021   MCV 83 05/30/2021   PLT 250 05/30/2021   Lab Results  Component Value Date   NA 143 10/02/2022   K 3.7 10/02/2022   CO2 23 10/02/2022   GLUCOSE 89 10/02/2022   BUN 12 10/02/2022   CREATININE 0.95 10/02/2022   BILITOT 0.5  10/02/2022   ALKPHOS 79 10/02/2022   AST 20 10/02/2022   ALT 14 10/02/2022   PROT 6.9 10/02/2022   ALBUMIN 4.1 10/02/2022   CALCIUM 8.8 10/02/2022   ANIONGAP 8 10/12/2014   EGFR 95 10/02/2022   Lab Results  Component Value Date   CHOL 178 10/02/2022   Lab Results  Component Value Date   HDL 50 10/02/2022   Lab Results  Component Value Date   LDLCALC 115 (H) 10/02/2022   Lab Results  Component Value Date   TRIG 66 10/02/2022   Lab Results  Component Value Date   CHOLHDL 3.6 10/02/2022   Lab Results  Component Value Date   HGBA1C 5.6 09/26/2020       Assessment & Plan:   Problem List Items Addressed This Visit   None    No orders of the defined types were placed in this encounter. 38 minutes spent multisystems reviewed high degree of complexity  Follow-up: No follow-ups on file.    Shan Levans, MD HPI

## 2022-12-03 ENCOUNTER — Encounter: Payer: Self-pay | Admitting: Critical Care Medicine

## 2022-12-03 ENCOUNTER — Ambulatory Visit: Payer: BLUE CROSS/BLUE SHIELD | Attending: Critical Care Medicine | Admitting: Critical Care Medicine

## 2022-12-03 VITALS — BP 129/78 | HR 60 | Wt 162.8 lb

## 2022-12-03 DIAGNOSIS — R1011 Right upper quadrant pain: Secondary | ICD-10-CM | POA: Diagnosis not present

## 2022-12-03 DIAGNOSIS — Z7722 Contact with and (suspected) exposure to environmental tobacco smoke (acute) (chronic): Secondary | ICD-10-CM

## 2022-12-03 DIAGNOSIS — I10 Essential (primary) hypertension: Secondary | ICD-10-CM

## 2022-12-03 DIAGNOSIS — K22710 Barrett's esophagus with low grade dysplasia: Secondary | ICD-10-CM | POA: Diagnosis not present

## 2022-12-03 MED ORDER — PANTOPRAZOLE SODIUM 40 MG PO TBEC: 40.0000 mg | DELAYED_RELEASE_TABLET | Freq: Every day | ORAL | 4 refills | Status: AC

## 2022-12-03 MED ORDER — AMLODIPINE BESYLATE 5 MG PO TABS: 5.0000 mg | ORAL_TABLET | Freq: Every day | ORAL | 3 refills | Status: AC

## 2022-12-03 MED ORDER — DICYCLOMINE HCL 20 MG PO TABS: 20.0000 mg | ORAL_TABLET | Freq: Four times a day (QID) | ORAL | 0 refills | Status: DC | PRN

## 2022-12-03 MED ORDER — SUCRALFATE 1 G PO TABS: 1.0000 g | ORAL_TABLET | Freq: Every day | ORAL | 4 refills | Status: AC

## 2022-12-03 NOTE — Assessment & Plan Note (Addendum)
Will send another referral back to gastroenterology and continue PPI therapy and Carafate

## 2022-12-03 NOTE — Patient Instructions (Addendum)
Call [facility] for appt with Dr Chales Abrahams  PH# [phone]  for exam for cancer screen of esophagus  Imaging will be ordered Medications refilled Return 4 months

## 2022-12-03 NOTE — Assessment & Plan Note (Signed)
Hypertension well-controlled continue amlodipine 5 mg daily

## 2022-12-03 NOTE — Assessment & Plan Note (Signed)
Due to long-term passive smoke exposure will obtain chest x-ray

## 2023-01-06 ENCOUNTER — Other Ambulatory Visit: Payer: Self-pay | Admitting: Critical Care Medicine

## 2023-01-07 NOTE — Telephone Encounter (Signed)
Requested medications are due for refill today.  yes  Requested medications are on the active medications list.  yes  Last refill. 12/03/2022 #40 0 rf  Future visit scheduled.   no  Notes to clinic.  Dr. Delford Field pt. Dr. Delford Field signed rx.    Requested Prescriptions  Pending Prescriptions Disp Refills   dicyclomine (BENTYL) 20 MG tablet [Pharmacy Med Name: DICYCLOMINE 20 MG TABLET] 120 tablet 1    Sig: Take 1 tablet (20 mg total) by mouth every 6 (six) hours as needed for spasms.     Gastroenterology:  Antispasmodic Agents Passed - 01/06/2023  1:31 PM      Passed - Valid encounter within last 12 months    Recent Outpatient Visits           1 month ago Primary hypertension   [facility] Storm Frisk, MD   3 months ago Barrett's esophagus with low grade dysplasia   [facility] Storm Frisk, MD   7 months ago Hepatomegalia   [facility] Storm Frisk, MD   10 months ago Barrett's esophagus with low grade dysplasia   [facility] Storm Frisk, MD   1 year ago Barrett's esophagus with low grade dysplasia   [facility] Storm Frisk, MD

## 2023-01-21 ENCOUNTER — Other Ambulatory Visit: Payer: Self-pay | Admitting: Family Medicine

## 2023-01-21 DIAGNOSIS — K05323 Chronic periodontitis, generalized, severe: Secondary | ICD-10-CM | POA: Diagnosis not present
